# Patient Record
Sex: Female | Born: 1965
Health system: Southern US, Community
[De-identification: ages and names within clinical notes are randomized; demographics above are authoritative.]

## PROBLEM LIST (undated history)

## (undated) DIAGNOSIS — Z9289 Personal history of other medical treatment: Secondary | ICD-10-CM

## (undated) DIAGNOSIS — F419 Anxiety disorder, unspecified: Secondary | ICD-10-CM

## (undated) DIAGNOSIS — G43909 Migraine, unspecified, not intractable, without status migrainosus: Secondary | ICD-10-CM

## (undated) DIAGNOSIS — F32A Depression, unspecified: Secondary | ICD-10-CM

## (undated) DIAGNOSIS — G4700 Insomnia, unspecified: Secondary | ICD-10-CM

## (undated) DIAGNOSIS — G250 Essential tremor: Secondary | ICD-10-CM

## (undated) DIAGNOSIS — T7840XA Allergy, unspecified, initial encounter: Secondary | ICD-10-CM

## (undated) DIAGNOSIS — M1990 Unspecified osteoarthritis, unspecified site: Secondary | ICD-10-CM

## (undated) DIAGNOSIS — K219 Gastro-esophageal reflux disease without esophagitis: Secondary | ICD-10-CM

## (undated) DIAGNOSIS — G709 Myoneural disorder, unspecified: Secondary | ICD-10-CM

## (undated) DIAGNOSIS — F329 Major depressive disorder, single episode, unspecified: Secondary | ICD-10-CM

## (undated) DIAGNOSIS — N393 Stress incontinence (female) (male): Secondary | ICD-10-CM

## (undated) DIAGNOSIS — E039 Hypothyroidism, unspecified: Secondary | ICD-10-CM

## (undated) HISTORY — DX: Depression, unspecified: F32.A

## (undated) HISTORY — DX: Unspecified osteoarthritis, unspecified site: M19.90

## (undated) HISTORY — DX: Stress incontinence (female) (male): N39.3

## (undated) HISTORY — DX: Insomnia, unspecified: G47.00

## (undated) HISTORY — DX: Hypothyroidism, unspecified: E03.9

## (undated) HISTORY — DX: Anxiety disorder, unspecified: F41.9

## (undated) HISTORY — DX: Migraine, unspecified, not intractable, without status migrainosus: G43.909

## (undated) HISTORY — PX: UPPER GASTROINTESTINAL ENDOSCOPY: SHX188

## (undated) HISTORY — DX: Essential tremor: G25.0

## (undated) HISTORY — DX: Personal history of other medical treatment: Z92.89

## (undated) HISTORY — DX: Myoneural disorder, unspecified: G70.9

## (undated) HISTORY — PX: OTHER SURGICAL HISTORY: SHX169

## (undated) HISTORY — DX: Allergy, unspecified, initial encounter: T78.40XA

## (undated) HISTORY — DX: Major depressive disorder, single episode, unspecified: F32.9

---
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
## 1999-04-04 ENCOUNTER — Other Ambulatory Visit: Admission: RE | Admit: 1999-04-04 | Discharge: 1999-04-04 | Payer: Self-pay | Admitting: Obstetrics and Gynecology

## 2000-05-29 ENCOUNTER — Other Ambulatory Visit: Admission: RE | Admit: 2000-05-29 | Discharge: 2000-05-29 | Payer: Self-pay | Admitting: *Deleted

## 2001-07-12 ENCOUNTER — Emergency Department (HOSPITAL_COMMUNITY): Admission: EM | Admit: 2001-07-12 | Discharge: 2001-07-12 | Payer: Self-pay | Admitting: Emergency Medicine

## 2002-11-18 ENCOUNTER — Other Ambulatory Visit: Admission: RE | Admit: 2002-11-18 | Discharge: 2002-11-18 | Payer: Self-pay | Admitting: Obstetrics and Gynecology

## 2004-02-09 ENCOUNTER — Other Ambulatory Visit: Admission: RE | Admit: 2004-02-09 | Discharge: 2004-02-09 | Payer: Self-pay | Admitting: *Deleted

## 2005-06-26 ENCOUNTER — Other Ambulatory Visit: Admission: RE | Admit: 2005-06-26 | Discharge: 2005-06-26 | Payer: Self-pay | Admitting: Obstetrics and Gynecology

## 2005-07-05 ENCOUNTER — Other Ambulatory Visit: Admission: RE | Admit: 2005-07-05 | Discharge: 2005-07-05 | Payer: Self-pay | Admitting: Obstetrics and Gynecology

## 2006-09-12 ENCOUNTER — Other Ambulatory Visit: Admission: RE | Admit: 2006-09-12 | Discharge: 2006-09-12 | Payer: Self-pay | Admitting: Obstetrics & Gynecology

## 2007-12-05 ENCOUNTER — Other Ambulatory Visit: Admission: RE | Admit: 2007-12-05 | Discharge: 2007-12-05 | Payer: Self-pay | Admitting: Obstetrics & Gynecology

## 2008-12-06 ENCOUNTER — Other Ambulatory Visit: Admission: RE | Admit: 2008-12-06 | Discharge: 2008-12-06 | Payer: Self-pay | Admitting: Obstetrics and Gynecology

## 2011-03-18 DIAGNOSIS — G250 Essential tremor: Secondary | ICD-10-CM

## 2011-03-18 HISTORY — DX: Essential tremor: G25.0

## 2011-03-18 LAB — HM PAP SMEAR: HM Pap smear: NORMAL

## 2011-04-13 ENCOUNTER — Other Ambulatory Visit (INDEPENDENT_AMBULATORY_CARE_PROVIDER_SITE_OTHER): Payer: BC Managed Care – PPO

## 2011-04-13 ENCOUNTER — Other Ambulatory Visit: Payer: Self-pay | Admitting: Internal Medicine

## 2011-04-13 ENCOUNTER — Ambulatory Visit (INDEPENDENT_AMBULATORY_CARE_PROVIDER_SITE_OTHER): Payer: BC Managed Care – PPO | Admitting: Internal Medicine

## 2011-04-13 ENCOUNTER — Encounter: Payer: Self-pay | Admitting: Internal Medicine

## 2011-04-13 VITALS — BP 120/78 | HR 68 | Temp 98.3°F | Ht 65.0 in | Wt 159.8 lb

## 2011-04-13 DIAGNOSIS — F329 Major depressive disorder, single episode, unspecified: Secondary | ICD-10-CM

## 2011-04-13 DIAGNOSIS — F32A Depression, unspecified: Secondary | ICD-10-CM

## 2011-04-13 DIAGNOSIS — G250 Essential tremor: Secondary | ICD-10-CM

## 2011-04-13 DIAGNOSIS — F419 Anxiety disorder, unspecified: Secondary | ICD-10-CM

## 2011-04-13 DIAGNOSIS — Z0000 Encounter for general adult medical examination without abnormal findings: Secondary | ICD-10-CM

## 2011-04-13 DIAGNOSIS — E039 Hypothyroidism, unspecified: Secondary | ICD-10-CM

## 2011-04-13 DIAGNOSIS — F411 Generalized anxiety disorder: Secondary | ICD-10-CM

## 2011-04-13 LAB — CBC WITH DIFFERENTIAL/PLATELET
Basophils Absolute: 0 10*3/uL (ref 0.0–0.1)
Basophils Relative: 0.4 % (ref 0.0–3.0)
Eosinophils Absolute: 0.1 10*3/uL (ref 0.0–0.7)
Eosinophils Relative: 1.6 % (ref 0.0–5.0)
HCT: 41.2 % (ref 36.0–46.0)
Hemoglobin: 14.1 g/dL (ref 12.0–15.0)
Lymphocytes Relative: 33 % (ref 12.0–46.0)
Lymphs Abs: 1.9 10*3/uL (ref 0.7–4.0)
MCHC: 34.3 g/dL (ref 30.0–36.0)
MCV: 88.7 fl (ref 78.0–100.0)
Monocytes Absolute: 0.3 10*3/uL (ref 0.1–1.0)
Monocytes Relative: 4.8 % (ref 3.0–12.0)
Neutro Abs: 3.5 10*3/uL (ref 1.4–7.7)
Neutrophils Relative %: 60.2 % (ref 43.0–77.0)
Platelets: 227 10*3/uL (ref 150.0–400.0)
RBC: 4.65 Mil/uL (ref 3.87–5.11)
RDW: 13.9 % (ref 11.5–14.6)
WBC: 5.8 10*3/uL (ref 4.5–10.5)

## 2011-04-13 LAB — BASIC METABOLIC PANEL
BUN: 9 mg/dL (ref 6–23)
CO2: 26 mEq/L (ref 19–32)
Calcium: 8.9 mg/dL (ref 8.4–10.5)
Chloride: 105 mEq/L (ref 96–112)
Creatinine, Ser: 0.8 mg/dL (ref 0.4–1.2)
GFR: 77.09 mL/min (ref >60.00)
Glucose, Bld: 97 mg/dL (ref 70–99)
Potassium: 3.8 mEq/L (ref 3.5–5.1)
Sodium: 141 mEq/L (ref 135–145)

## 2011-04-13 LAB — URINALYSIS
Bilirubin Urine: NEGATIVE
Hgb urine dipstick: NEGATIVE
Ketones, ur: NEGATIVE
Leukocytes, UA: NEGATIVE
Nitrite: NEGATIVE
Specific Gravity, Urine: <=1.005 (ref 1.000–1.030)
Total Protein, Urine: NEGATIVE
Urine Glucose: NEGATIVE
Urobilinogen, UA: 0.2 (ref 0.0–1.0)
pH: 6 (ref 5.0–8.0)

## 2011-04-13 LAB — HEPATIC FUNCTION PANEL
ALT: 16 U/L (ref 0–35)
AST: 19 U/L (ref 0–37)
Albumin: 4.1 g/dL (ref 3.5–5.2)
Alkaline Phosphatase: 42 U/L (ref 39–117)
Bilirubin, Direct: 0.1 mg/dL (ref 0.0–0.3)
Total Bilirubin: 0.7 mg/dL (ref 0.3–1.2)
Total Protein: 7.6 g/dL (ref 6.0–8.3)

## 2011-04-13 LAB — TSH: TSH: 1.4 u[IU]/mL (ref 0.35–5.50)

## 2011-04-13 LAB — LIPID PANEL
Cholesterol: 214 mg/dL — ABNORMAL HIGH (ref 0–200)
HDL: 91.4 mg/dL (ref >39.00)
Total CHOL/HDL Ratio: 2
Triglycerides: 113 mg/dL (ref 0.0–149.0)
VLDL: 22.6 mg/dL (ref 0.0–40.0)

## 2011-04-13 LAB — LDL CHOLESTEROL, DIRECT: Direct LDL: 107.2 mg/dL

## 2011-04-13 MED ORDER — PROPRANOLOL HCL ER 60 MG PO CP24: 60.0000 mg | ORAL_CAPSULE | Freq: Every day | ORAL | Status: DC

## 2011-04-13 MED ORDER — CITALOPRAM HYDROBROMIDE 10 MG PO TABS: 10.0000 mg | ORAL_TABLET | Freq: Every day | ORAL | Status: DC

## 2011-04-13 NOTE — Patient Instructions (Signed)
It was good to see you today. We have reviewed your prior medical history and will send for records including labs and tests from [facility] Test(s) ordered today. Your results will be called to you after review (48-72hours after test completion). If any changes need to be made, you will be notified at that time. Start Celexa for depression/anxiety symptoms and Inderal for head/neck tremor - Your prescription(s) have been submitted to your pharmacy. Please take as directed and contact our office if you believe you are having problem(s) with the medication(s). Ok to try massage as discussed - this will not hurt Please schedule followup in 4-6 weeks to re evaluate symptoms and medications, call sooner if problems.

## 2011-04-13 NOTE — Progress Notes (Signed)
Subjective:    Patient ID: Katherine Garrett, female    DOB: January 09, 1966, 45 y.o.   MRN: [id number]  HPI  New pt to me and our practice, here to establish care - Also patient is here today for annual physical. Patient feels well   complains of voice change and head tremor Onset 3 years ago - "hoarseness" never goes away Not associated with sore throat, GERD symptoms or cough Never a smoker Increased head tremor over same time period, chronic hand tremor also slightly worse symptoms worse with stress and anxiety +FH tremor but no parkinson dz No new medications, no known risk for heavy metal poisoning   Also complains of increasing anxiety and "nervousness" Onset 8 years ago Precipitated by death of spouse to stomach cancer associated with insomnia and increasing fatigue +situational depression but denies SI/HI tx by gyn with celexa x 3 mo with improvement in symptoms - but then med stopped few months ago ("no particular reason", ?no follow up)  Also reviewed chronic medical issues today: Hypothyroid - dx with 2nd pregnancy Feb 05, 1997 - to date, managed by gyn. patient reports compliance with medication(s) as prescribed. Denies adverse side effects. +weight gain but no bowel change; +fatigue, chronic  Migraines - infreq episodes - no need for medications to control same  Past Medical History  Diagnosis Date  . Migraines   . Urine, incontinence, stress female   . Hypothyroid dx 1998  . Anxiety   . Depression   . Insomnia 02-06-2003 onset    following death of spouse    Family History  Problem Relation Age of Onset  . Arthritis Mother   . Ovarian cancer Mother   . Cancer Mother 60    ovarian cancer  . Hyperlipidemia Mother   . Anxiety disorder Mother   . Depression Mother   . Arthritis Father   . Anxiety disorder Father   . Cirrhosis Brother     hep c  . Heart disease Maternal Grandmother   . Heart disease Maternal Grandfather    History  Substance Use Topics  . Smoking  status: Never Smoker   . Smokeless tobacco: Not on file  . Alcohol Use: Yes     rarely    Review of Systems Constitutional: Negative for fever. +weight gain: 14lbs in last 12 mo associated with with decrease in physical activity Respiratory: Negative for cough and shortness of breath.   Cardiovascular: Negative for chest pain.  No syncope or palpiatations Gastrointestinal: Negative for abdominal pain. no nausea and vomiting or bowel changes Musculoskeletal: Negative for gait problem. no back pain or joint swelling. Skin: Negative for rash.  Neurological: Negative for dizziness. positive for tremor - see above No other specific complaints in a complete review of systems (except as listed in HPI above).     Objective:   Physical Exam BP 120/78  Pulse 68  Temp(Src) 98.3 F (36.8 C) (Oral)  Ht 5\' 5"  (1.651 m)  Wt 159 lb 12.8 oz (72.485 kg)  BMI 26.59 kg/m2  SpO2 97%  Constitutional: She is oriented to person, place, and time. She appears well-developed and well-nourished. No distress.  HENT: Head: Normocephalic and atraumatic. Ears; B TMs ok, no erythema or effusion; Nose: Nose normal.  Mouth/Throat: Oropharynx is clear and moist. No oropharyngeal exudate.  Eyes: Conjunctivae and EOM are normal. Pupils are equal, round, and reactive to light. No scleral icterus.  Neck: Normal range of motion. No LAD. No JVD present. No thyromegaly present.  Cardiovascular: Normal rate,  regular rhythm and normal heart sounds.  No murmur heard. No BLE edema. Pulmonary/Chest: Effort normal and breath sounds normal. No respiratory distress. She has no wheezes.  Abdominal: Soft. Bowel sounds are normal. She exhibits no distension. There is no tenderness. No masses. Musculoskeletal: Mild-mod myofascial spasm along R neck in posterior trap region. Otherwise normal range of motion, no joint effusions. No gross deformities Neurological: Postural tremor noted in BUE R>L hands and mild head tremor. No  dysarthria but +voice tremor. Normal gait, normal finger to nose with mild end activity tremor. She is alert and oriented to person, place, and time. No cranial nerve deficit. Coordination normal and balance normal - normal DTRs.  Skin: Skin is warm and dry. No rash noted. No erythema. No wounds. Psychiatric: She has minimally anxious mood and affect. Her behavior is normal. Judgment and thought content normal.   No results found for this basename: WBC, HGB, HCT, PLT, CHOL, TRIG, HDL, LDLDIRECT, ALT, AST, NA, K, CL, CREATININE, BUN, CO2, TSH, PSA, INR, GLUF, HGBA1C, MICROALBUR   EKG - NSR, no ischemic changes or arrythmia     Assessment & Plan:   CPX - v70.0 - Patient has been counseled on age-appropriate routine health concerns for screening and prevention. These are reviewed and up-to-date. Immunizations are up-to-date or declined. Labs ordered and ECG reviewed. Will also send for records from gyn when most medical care has been to date  Also see problem list. Medications reviewed today.

## 2011-04-14 LAB — VITAMIN D 25 HYDROXY (VIT D DEFICIENCY, FRACTURES): Vit D, 25-Hydroxy: 58 ng/mL (ref 30–89)

## 2011-04-15 ENCOUNTER — Encounter: Payer: Self-pay | Admitting: Internal Medicine

## 2011-04-15 DIAGNOSIS — F419 Anxiety disorder, unspecified: Secondary | ICD-10-CM | POA: Insufficient documentation

## 2011-04-15 DIAGNOSIS — G250 Essential tremor: Secondary | ICD-10-CM | POA: Insufficient documentation

## 2011-04-15 DIAGNOSIS — F32A Depression, unspecified: Secondary | ICD-10-CM | POA: Insufficient documentation

## 2011-04-15 DIAGNOSIS — E039 Hypothyroidism, unspecified: Secondary | ICD-10-CM | POA: Insufficient documentation

## 2011-04-15 DIAGNOSIS — F329 Major depressive disorder, single episode, unspecified: Secondary | ICD-10-CM | POA: Insufficient documentation

## 2011-04-15 NOTE — Assessment & Plan Note (Signed)
Check TSH as with CPX labs today - titrate as needed Dx 1998 - stable by history The current medical regimen is effective;  continue present plan and medications.

## 2011-04-15 NOTE — Assessment & Plan Note (Signed)
History and exam most consistent with essential tremor - no red flags for other neurologic deficits, no offending meds Check screening labs as for CPX Start low dose inderal now and plan follow up 6 weeks to review symptoms

## 2011-04-15 NOTE — Assessment & Plan Note (Signed)
Began situational following death of spouse 01-31-2003 - note FH anxiety Recent treatment trial with celexa provided improvement in symptoms - resume low dose celexa now Also to start low dose beta-blocker as for essential tremor - advised may help  symptoms or may exacerbate depression overlay Pt understands and agrees to same - will call if problems follow up 4-6 weeks to review meds and symptoms

## 2011-04-17 ENCOUNTER — Encounter: Payer: Self-pay | Admitting: *Deleted

## 2011-04-18 ENCOUNTER — Telehealth: Payer: Self-pay | Admitting: *Deleted

## 2011-04-18 NOTE — Telephone Encounter (Signed)
Pt call back concerning msg's that was left. She states she has been out of town.  Gave her results concerning labs, also informed her i mailed a letter w/ copy of labs to her address...04/18/11@4 :52pm/LMB

## 2011-05-28 ENCOUNTER — Encounter: Payer: Self-pay | Admitting: Internal Medicine

## 2011-05-28 ENCOUNTER — Ambulatory Visit (INDEPENDENT_AMBULATORY_CARE_PROVIDER_SITE_OTHER): Payer: BC Managed Care – PPO | Admitting: Internal Medicine

## 2011-05-28 VITALS — BP 112/70 | HR 67 | Temp 98.2°F | Ht 65.0 in | Wt 156.8 lb

## 2011-05-28 DIAGNOSIS — E039 Hypothyroidism, unspecified: Secondary | ICD-10-CM

## 2011-05-28 DIAGNOSIS — F411 Generalized anxiety disorder: Secondary | ICD-10-CM

## 2011-05-28 DIAGNOSIS — F419 Anxiety disorder, unspecified: Secondary | ICD-10-CM

## 2011-05-28 DIAGNOSIS — G250 Essential tremor: Secondary | ICD-10-CM

## 2011-05-28 DIAGNOSIS — Z23 Encounter for immunization: Secondary | ICD-10-CM

## 2011-05-28 MED ORDER — PROPRANOLOL HCL 10 MG PO TABS: 10.0000 mg | ORAL_TABLET | Freq: Three times a day (TID) | ORAL | Status: DC | PRN

## 2011-05-28 NOTE — Assessment & Plan Note (Signed)
History and exam most consistent with essential tremor - no red flags for other neurologic deficits, no offending meds continue low dose ext release inderal , add prn 10mg  dose for breakthru symptoms  plan follow up 3  to review symptoms - sooner if problems

## 2011-05-28 NOTE — Patient Instructions (Signed)
It was good to see you today. continue Celexa for depression/anxiety symptoms and Inderal for head/neck tremor -  alos use low dose quick acting inderal for breakthru anxiety/tremor as needed - Your prescription(s) have been submitted to your pharmacy. Please take as directed and contact our office if you believe you are having problem(s) with the medication(s). Please schedule followup in 3-4 monthss to re evaluate symptoms and medications, call sooner if problems.

## 2011-05-28 NOTE — Assessment & Plan Note (Signed)
Began situational following death of spouse 01/28/2003 - note FH anxiety Resumed celexa 03/2011 improvement in symptoms -but ?borderline ahendonia Discussed options - will continue same without change for now Also continue low dose beta-blocker as for essential tremor - advised may help  symptoms or may exacerbate depression overlay Pt understands and agrees to same - will call if problems follow up 3-4 monhts to review meds and symptoms

## 2011-05-28 NOTE — Assessment & Plan Note (Signed)
Dx 1998 - stable by history The current medical regimen is effective;  continue present plan and medications.  Lab Results  Component Value Date   TSH 1.40 04/13/2011

## 2011-05-28 NOTE — Progress Notes (Signed)
  Subjective:    Patient ID: Katherine Garrett, female    DOB: 09/04/1966, 45 y.o.   MRN: [id number]  HPI  Here for follow up -   benign essential tremor - dx clarified 03/2011 -  started Inderal LA 60 for same and symptoms improved but not voice change resolved - head/hands much better associated with voice > head > B hands tremor Onset 02/02/08 -  describes voice as "hoarseness" never goes away, worse with nervous/stressed Not associated with sore throat, GERD symptoms or cough Never a smoker Increased head tremor over same time period, chronic hand tremor also worse symptoms worse with stress and anxiety +FH tremor but no parkinson dz  anxiety and "nervousness" Onset 02/02/03 - Precipitated by death of spouse to stomach cancer associated with insomnia and motivational/physical fatigue +situational depression but denies SI/HI Resumed celexa 03/2011 - feels improved mood swings and less irritability but +anhedonia- "i don't even cry now"  Hypothyroid - dx with 2nd pregnancy 1997-02-01 - previously managed by gyn. patient reports compliance with medication(s) as prescribed. Denies adverse side effects. +weight gain but no bowel change; +fatigue, chronic  Migraines - infreq episodes - no need for medications to control same  Past Medical History  Diagnosis Date  . Migraines   . Urine, incontinence, stress female   . Hypothyroid dx 1998  . Anxiety   . Depression   . Insomnia 2004 onset    following death of spouse  . Tremor, essential     Review of Systems  Constitutional: Negative for fever. or unexpected weight change Respiratory: Negative for cough and shortness of breath.   Cardiovascular: Negative for chest pain.  No syncope or palpiatations     Objective:   Physical Exam  BP 112/70  Pulse 67  Temp(Src) 98.2 F (36.8 C) (Oral)  Ht 5\' 5"  (1.651 m)  Wt 156 lb 12.8 oz (71.124 kg)  BMI 26.09 kg/m2  SpO2 96%  Constitutional: She appears well-developed and well-nourished. No  distress.  Eyes: Conjunctivae and EOM are normal. Pupils are equal, round, and reactive to light. No scleral icterus.  Neck: Normal range of motion. No LAD. No JVD present. No thyromegaly present.  Cardiovascular: Normal rate, regular rhythm and normal heart sounds.  No murmur heard. No BLE edema. Pulmonary/Chest: Effort normal and breath sounds normal. No respiratory distress. She has no wheezes. Neurological: Postural tremor in hands resolved -very mild head tremor improved. Continued moderate +voice tremor. Normal gait, normal finger to nose with mild end activity tremor. She is alert and oriented to person, place, and time. No cranial nerve deficit. Coordination normal and balance normal - normal DTRs. Psychiatric: She has minimally anxious mood and affect. Her behavior is normal. Judgment and thought content normal.    Lab Results  Component Value Date   WBC 5.8 04/13/2011   HGB 14.1 04/13/2011   HCT 41.2 04/13/2011   PLT 227.0 04/13/2011   CHOL 214* 04/13/2011   TRIG 113.0 04/13/2011   HDL 91.40 04/13/2011   LDLDIRECT 107.2 04/13/2011   ALT 16 04/13/2011   AST 19 04/13/2011   NA 141 04/13/2011   K 3.8 04/13/2011   CL 105 04/13/2011   CREATININE 0.8 04/13/2011   BUN 9 04/13/2011   CO2 26 04/13/2011   TSH 1.40 04/13/2011       Assessment & Plan:   see problem list. Medications reviewed today.

## 2011-07-12 ENCOUNTER — Other Ambulatory Visit: Payer: Self-pay | Admitting: Internal Medicine

## 2011-08-10 ENCOUNTER — Other Ambulatory Visit: Payer: Self-pay | Admitting: Internal Medicine

## 2011-10-13 ENCOUNTER — Other Ambulatory Visit: Payer: Self-pay | Admitting: Internal Medicine

## 2012-01-15 ENCOUNTER — Other Ambulatory Visit: Payer: Self-pay | Admitting: *Deleted

## 2012-01-15 DIAGNOSIS — G250 Essential tremor: Secondary | ICD-10-CM

## 2012-01-15 MED ORDER — PROPRANOLOL HCL ER 60 MG PO CP24: 60.0000 mg | ORAL_CAPSULE | Freq: Every day | ORAL | Status: DC

## 2012-01-15 MED ORDER — CITALOPRAM HYDROBROMIDE 10 MG PO TABS: 10.0000 mg | ORAL_TABLET | Freq: Every day | ORAL | Status: DC

## 2012-01-26 ENCOUNTER — Other Ambulatory Visit: Payer: Self-pay | Admitting: Internal Medicine

## 2012-04-21 ENCOUNTER — Other Ambulatory Visit: Payer: Self-pay | Admitting: Internal Medicine

## 2012-06-15 ENCOUNTER — Other Ambulatory Visit: Payer: Self-pay | Admitting: Internal Medicine

## 2012-10-27 ENCOUNTER — Other Ambulatory Visit: Payer: Self-pay | Admitting: Internal Medicine

## 2012-10-27 NOTE — Telephone Encounter (Signed)
Katherine Garrett, Will refill but has not seen Dr. Milton Ferguson in over a year. Will need appt before further refills can be given [facility]

## 2012-11-24 ENCOUNTER — Other Ambulatory Visit: Payer: Self-pay | Admitting: Internal Medicine

## 2013-03-02 ENCOUNTER — Other Ambulatory Visit: Payer: Self-pay | Admitting: Internal Medicine

## 2013-04-11 ENCOUNTER — Other Ambulatory Visit: Payer: Self-pay | Admitting: Internal Medicine

## 2013-05-04 ENCOUNTER — Other Ambulatory Visit: Payer: Self-pay | Admitting: Internal Medicine

## 2013-05-22 ENCOUNTER — Other Ambulatory Visit: Payer: Self-pay | Admitting: Nurse Practitioner

## 2013-05-22 NOTE — Telephone Encounter (Signed)
eScribe request for refill on KARIVA Last filled - 06/06/12 X 1 YEAR Last AEX - 06/06/12 Next AEX - 06/09/13 1 month supply sent until AEX

## 2013-05-25 ENCOUNTER — Other Ambulatory Visit: Payer: Self-pay | Admitting: Nurse Practitioner

## 2013-05-27 ENCOUNTER — Other Ambulatory Visit: Payer: Self-pay

## 2013-05-27 ENCOUNTER — Telehealth: Payer: Self-pay | Admitting: Internal Medicine

## 2013-05-27 DIAGNOSIS — Z1231 Encounter for screening mammogram for malignant neoplasm of breast: Secondary | ICD-10-CM

## 2013-05-27 MED ORDER — PROPRANOLOL HCL ER 60 MG PO CP24: ORAL_CAPSULE | ORAL | Status: DC

## 2013-05-27 NOTE — Telephone Encounter (Signed)
Pt's LOV was in 2012.  I scheduled her physical on the next avail., Nov 24.  She needs a refill on Inderal.  Could she have enough to last to her appt?

## 2013-05-27 NOTE — Telephone Encounter (Signed)
ok 

## 2013-05-27 NOTE — Telephone Encounter (Signed)
Sent refill to pharmacy enough until pt see md on nov 24...lmb

## 2013-05-28 ENCOUNTER — Other Ambulatory Visit: Payer: Self-pay | Admitting: Internal Medicine

## 2013-06-01 ENCOUNTER — Ambulatory Visit
Admission: RE | Admit: 2013-06-01 | Discharge: 2013-06-01 | Disposition: A | Discharge status: Home / Self Care | Payer: BC Managed Care – PPO | Source: Ambulatory Visit

## 2013-06-01 DIAGNOSIS — Z1231 Encounter for screening mammogram for malignant neoplasm of breast: Secondary | ICD-10-CM

## 2013-06-09 ENCOUNTER — Ambulatory Visit (INDEPENDENT_AMBULATORY_CARE_PROVIDER_SITE_OTHER): Payer: BC Managed Care – PPO | Admitting: Nurse Practitioner

## 2013-06-09 ENCOUNTER — Encounter: Payer: Self-pay | Admitting: Nurse Practitioner

## 2013-06-09 VITALS — BP 110/72 | HR 60 | Resp 20 | Ht 64.5 in | Wt 169.0 lb

## 2013-06-09 DIAGNOSIS — Z0000 Encounter for general adult medical examination without abnormal findings: Secondary | ICD-10-CM

## 2013-06-09 DIAGNOSIS — Z01419 Encounter for gynecological examination (general) (routine) without abnormal findings: Secondary | ICD-10-CM

## 2013-06-09 LAB — HM PAP SMEAR

## 2013-06-09 LAB — POCT URINALYSIS DIPSTICK
Bilirubin, UA: 1
Glucose, UA: NEGATIVE
Ketones, UA: NEGATIVE
Nitrite, UA: NEGATIVE
Urobilinogen, UA: NEGATIVE
pH, UA: 5

## 2013-06-09 MED ORDER — DESOGESTREL-ETHINYL ESTRADIOL 0.15-0.02/0.01 MG (21/5) PO TABS: ORAL_TABLET | ORAL | Status: DC

## 2013-06-09 NOTE — Progress Notes (Signed)
Patient ID: Katherine Garrett, female   DOB: 1966/09/16, 47 y.o.   MRN: [id number] 47 y.o. G40P2010 Widowed Caucasian Fe here for annual exam.   Menses now at 4-5 days. Some bloating.  Not dating or sexually for 2 years.  Mother who had been ill has now passed away.  That also has caused depressive symptoms to exacerbate.  She will be seeing PCP in about a month and will discuss this then.  Considering classes at [facility] [employer] to reenter into the nursing program.  Oldest son has graduated from college and youngest son is doing OK now.  Patient's last menstrual period was 05/21/2013.          Sexually active: no  The current method of family planning is none.    Exercising: yes  Gym/ health club routine includes light weights and and working on various equipment. Smoker:  no  Health Maintenance: Pap:  06/06/12, WNL, neg HR HPV MMG:  06/01/13, BI-Rads 1: negative TDaP: 05/28/11 @ PCP Labs: declined HB Urine: 1+ leuk's, tr blood, tr protein, tr bili, pH 5.0 (asymptomatic)   reports that she has never smoked. She has never used smokeless tobacco. She reports that  drinks alcohol. She reports that she does not use illicit drugs.  Past Medical History  Diagnosis Date  . Migraines   . Urine, incontinence, stress female   . Hypothyroid dx 1998  . Anxiety   . Depression   . Insomnia 2004 onset    following death of spouse  . Tremor, essential 03/2011    Past Surgical History  Procedure Laterality Date  . Tonsillectomy  1970  . Child birth  50 & 10    x's 2    Current Outpatient Prescriptions  Medication Sig Dispense Refill  . citalopram (CELEXA) 10 MG tablet TAKE 1 TABLET (10 MG TOTAL) BY MOUTH DAILY.  30 tablet  2  . desogestrel-ethinyl estradiol (KARIVA,AZURETTE,MIRCETTE) 0.15-0.02/0.01 MG (21/5) tablet TAKE 1 TABLET BY MOUTH EVERY DAY  84 tablet  3  . levothyroxine (SYNTHROID, LEVOTHROID) 112 MCG tablet Take 1 by mouth daily      . propranolol (INDERAL) 10 MG tablet TAKE 1 TABLET BY MOUTH 3 TIMES A  DAY AS NEEDED FOR ANXIETY  90 tablet  0  . propranolol ER (INDERAL LA) 60 MG 24 hr capsule Take 60 mg by mouth daily.       No current facility-administered medications for this visit.    Family History  Problem Relation Age of Onset  . Arthritis Mother   . Ovarian cancer Mother   . Cancer Mother 73    ovarian cancer  . Hyperlipidemia Mother   . Anxiety disorder Mother   . Depression Mother   . Arthritis Father   . Anxiety disorder Father   . Diabetes Father   . Cirrhosis Brother     hep c  . Heart disease Maternal Grandmother   . Heart disease Maternal Grandfather     ROS:  Pertinent items are noted in HPI.  Otherwise, a comprehensive ROS was negative.  Exam:   BP 110/72  Pulse 60  Resp 20  Ht 5' 4.5" (1.638 m)  Wt 169 lb (76.658 kg)  BMI 28.57 kg/m2  LMP 05/21/2013 Height: 5' 4.5" (163.8 cm)  Ht Readings from Last 3 Encounters:  06/09/13 5' 4.5" (1.638 m)  05/28/11 5\' 5"  (1.651 m)  04/13/11 5\' 5"  (1.651 m)    General appearance: alert, cooperative and appears stated age Head: Normocephalic, without obvious  abnormality, atraumatic Neck: no adenopathy, supple, symmetrical, trachea midline and thyroid normal to inspection and palpation Lungs: clear to auscultation bilaterally Breasts: normal appearance, no masses or tenderness Heart: regular rate and rhythm Abdomen: soft, non-tender; no masses,  no organomegaly Extremities: extremities normal, atraumatic, no cyanosis or edema Skin: Skin color, texture, turgor normal. No rashes or lesions Lymph nodes: Cervical, supraclavicular, and axillary nodes normal. No abnormal inguinal nodes palpated Neurologic: Grossly normal   Pelvic: External genitalia:  no lesions              Urethra:  normal appearing urethra with no masses, tenderness or lesions              Bartholin's and Skene's: normal                 Vagina: normal appearing vagina with normal color and discharge, no lesions              Cervix: anteverted               Pap taken: no Bimanual Exam:  Uterus:  normal size, contour, position, consistency, mobility, non-tender              Adnexa: no mass, fullness, tenderness               Rectovaginal: Confirms               Anus:  normal sphincter tone, no lesions  A:  Well Woman with normal exam  History of hypothyroid  OCP for regulation of menses  P:   Pap smear as per guidelines   Mammogram due again 9/15  PCP is now doing labs and will get her refill of Synthroid there. (per patient she has enough to last)  Counseled on breast self exam, adequate intake of calcium and vitamin D, diet and exercise return annually or prn  An After Visit Summary was printed and given to the patient.

## 2013-06-09 NOTE — Patient Instructions (Signed)

## 2013-06-15 NOTE — Progress Notes (Signed)
Encounter reviewed by Dr. Tameah Mihalko Silva.  

## 2013-07-05 ENCOUNTER — Other Ambulatory Visit: Payer: Self-pay | Admitting: Nurse Practitioner

## 2013-07-06 ENCOUNTER — Other Ambulatory Visit: Payer: Self-pay | Admitting: Internal Medicine

## 2013-07-06 NOTE — Telephone Encounter (Signed)
According to AEX note patient is getting Synthroid from PCP.  Rx denied.

## 2013-07-11 ENCOUNTER — Other Ambulatory Visit: Payer: Self-pay | Admitting: Nurse Practitioner

## 2013-07-16 ENCOUNTER — Telehealth: Payer: Self-pay | Admitting: Nurse Practitioner

## 2013-07-16 NOTE — Telephone Encounter (Signed)
Patient needs a one month refill on synthroid to hold her over until her PCP appt on 11/24. Please call patient when complete. [facility] [address].

## 2013-07-17 ENCOUNTER — Other Ambulatory Visit: Payer: Self-pay | Admitting: Nurse Practitioner

## 2013-07-17 NOTE — Telephone Encounter (Signed)
rx sent for 1 mth with no refills. Pt notified

## 2013-07-23 ENCOUNTER — Other Ambulatory Visit: Payer: Self-pay

## 2013-08-06 ENCOUNTER — Other Ambulatory Visit: Payer: Self-pay | Admitting: Internal Medicine

## 2013-08-10 ENCOUNTER — Encounter: Payer: Self-pay | Admitting: Internal Medicine

## 2013-08-10 ENCOUNTER — Ambulatory Visit (INDEPENDENT_AMBULATORY_CARE_PROVIDER_SITE_OTHER): Payer: BC Managed Care – PPO | Admitting: Internal Medicine

## 2013-08-10 ENCOUNTER — Encounter: Payer: BC Managed Care – PPO | Admitting: Internal Medicine

## 2013-08-10 ENCOUNTER — Other Ambulatory Visit (INDEPENDENT_AMBULATORY_CARE_PROVIDER_SITE_OTHER): Payer: BC Managed Care – PPO

## 2013-08-10 VITALS — BP 122/80 | HR 62 | Temp 97.9°F | Ht 64.5 in | Wt 179.0 lb

## 2013-08-10 DIAGNOSIS — Z23 Encounter for immunization: Secondary | ICD-10-CM

## 2013-08-10 DIAGNOSIS — Z0000 Encounter for general adult medical examination without abnormal findings: Secondary | ICD-10-CM

## 2013-08-10 DIAGNOSIS — E039 Hypothyroidism, unspecified: Secondary | ICD-10-CM

## 2013-08-10 DIAGNOSIS — F329 Major depressive disorder, single episode, unspecified: Secondary | ICD-10-CM

## 2013-08-10 DIAGNOSIS — G250 Essential tremor: Secondary | ICD-10-CM

## 2013-08-10 DIAGNOSIS — F32A Depression, unspecified: Secondary | ICD-10-CM

## 2013-08-10 LAB — BASIC METABOLIC PANEL
BUN: 11 mg/dL (ref 6–23)
CO2: 26 mEq/L (ref 19–32)
Calcium: 8.8 mg/dL (ref 8.4–10.5)
Chloride: 107 mEq/L (ref 96–112)
Creatinine, Ser: 0.9 mg/dL (ref 0.4–1.2)
GFR: 71.42 mL/min (ref >60.00)
Glucose, Bld: 95 mg/dL (ref 70–99)
Potassium: 4 mEq/L (ref 3.5–5.1)
Sodium: 138 mEq/L (ref 135–145)

## 2013-08-10 LAB — URINALYSIS, ROUTINE W REFLEX MICROSCOPIC
Bilirubin Urine: NEGATIVE
Hgb urine dipstick: NEGATIVE
Ketones, ur: NEGATIVE
Leukocytes, UA: NEGATIVE
Nitrite: NEGATIVE
RBC / HPF: NONE SEEN (ref 0–?)
Specific Gravity, Urine: <=1.005 (ref 1.000–1.030)
Total Protein, Urine: NEGATIVE
Urine Glucose: NEGATIVE
Urobilinogen, UA: 0.2 (ref 0.0–1.0)
WBC, UA: NONE SEEN (ref 0–?)
pH: 6 (ref 5.0–8.0)

## 2013-08-10 LAB — TSH: TSH: 3.95 u[IU]/mL (ref 0.35–5.50)

## 2013-08-10 LAB — HEPATIC FUNCTION PANEL
ALT: 15 U/L (ref 0–35)
AST: 17 U/L (ref 0–37)
Albumin: 3.5 g/dL (ref 3.5–5.2)
Alkaline Phosphatase: 36 U/L — ABNORMAL LOW (ref 39–117)
Bilirubin, Direct: 0.1 mg/dL (ref 0.0–0.3)
Total Bilirubin: 0.5 mg/dL (ref 0.3–1.2)
Total Protein: 6.9 g/dL (ref 6.0–8.3)

## 2013-08-10 LAB — CBC WITH DIFFERENTIAL/PLATELET
Basophils Absolute: 0 10*3/uL (ref 0.0–0.1)
Basophils Relative: 0.4 % (ref 0.0–3.0)
Eosinophils Absolute: 0.2 10*3/uL (ref 0.0–0.7)
Eosinophils Relative: 1.6 % (ref 0.0–5.0)
HCT: 40.8 % (ref 36.0–46.0)
Hemoglobin: 13.9 g/dL (ref 12.0–15.0)
Lymphocytes Relative: 26.7 % (ref 12.0–46.0)
Lymphs Abs: 2.5 10*3/uL (ref 0.7–4.0)
MCHC: 34 g/dL (ref 30.0–36.0)
MCV: 88.4 fl (ref 78.0–100.0)
Monocytes Absolute: 0.5 10*3/uL (ref 0.1–1.0)
Monocytes Relative: 5.5 % (ref 3.0–12.0)
Neutro Abs: 6.3 10*3/uL (ref 1.4–7.7)
Neutrophils Relative %: 65.8 % (ref 43.0–77.0)
Platelets: 255 10*3/uL (ref 150.0–400.0)
RBC: 4.61 Mil/uL (ref 3.87–5.11)
RDW: 14.3 % (ref 11.5–14.6)
WBC: 9.5 10*3/uL (ref 4.5–10.5)

## 2013-08-10 LAB — LIPID PANEL
Cholesterol: 206 mg/dL — ABNORMAL HIGH (ref 0–200)
HDL: 83 mg/dL (ref >39.00)
Total CHOL/HDL Ratio: 2
Triglycerides: 133 mg/dL (ref 0.0–149.0)
VLDL: 26.6 mg/dL (ref 0.0–40.0)

## 2013-08-10 LAB — LDL CHOLESTEROL, DIRECT: Direct LDL: 110.1 mg/dL

## 2013-08-10 MED ORDER — CITALOPRAM HYDROBROMIDE 20 MG PO TABS: 20.0000 mg | ORAL_TABLET | Freq: Every day | ORAL | Status: DC

## 2013-08-10 NOTE — Patient Instructions (Addendum)
It was good to see you today.  We have reviewed your prior records including labs and tests today  Health Maintenance reviewed - all recommended immunizations and age-appropriate screenings are up-to-date. Your annual flu shot was given and/or updated today.  Test(s) ordered today. Your results will be released to MyChart (or called to you) after review, usually within 72hours after test completion. If any changes need to be made, you will be notified at that same time.  Medications reviewed and updated  Increase Celexa to 20mg  daily for depression - no other changes recommended at this time.  Your prescription(s) have been submitted to your pharmacy. Please take as directed and contact our office if you believe you are having problem(s) with the medication(s).  Please schedule followup in 6-12 weeks to recheck depression and medications, call sooner if problems.  Health Maintenance, Female A healthy lifestyle and preventative care can promote health and wellness.  Maintain regular health, dental, and eye exams.  Eat a healthy diet. Foods like vegetables, fruits, whole grains, low-fat dairy products, and lean protein foods contain the nutrients you need without too many calories. Decrease your intake of foods high in solid fats, added sugars, and salt. Get information about a proper diet from your caregiver, if necessary.  Regular physical exercise is one of the most important things you can do for your health. Most adults should get at least 150 minutes of moderate-intensity exercise (any activity that increases your heart rate and causes you to sweat) each week. In addition, most adults need muscle-strengthening exercises on 2 or more days a week.   Maintain a healthy weight. The body mass index (BMI) is a screening tool to identify possible weight problems. It provides an estimate of body fat based on height and weight. Your caregiver can help determine your BMI, and can help you  achieve or maintain a healthy weight. For adults 20 years and older:  A BMI below 18.5 is considered underweight.  A BMI of 18.5 to 24.9 is normal.  A BMI of 25 to 29.9 is considered overweight.  A BMI of 30 and above is considered obese.  Maintain normal blood lipids and cholesterol by exercising and minimizing your intake of saturated fat. Eat a balanced diet with plenty of fruits and vegetables. Blood tests for lipids and cholesterol should begin at age 36 and be repeated every 5 years. If your lipid or cholesterol levels are high, you are over 50, or you are a high risk for heart disease, you may need your cholesterol levels checked more frequently.Ongoing high lipid and cholesterol levels should be treated with medicines if diet and exercise are not effective.  If you smoke, find out from your caregiver how to quit. If you do not use tobacco, do not start.  Lung cancer screening is recommended for adults aged 61 80 years who are at high risk for developing lung cancer because of a history of smoking. Yearly low-dose computed tomography (CT) is recommended for people who have at least a 30-pack-year history of smoking and are a current smoker or have quit within the past 15 years. A pack year of smoking is smoking an average of 1 pack of cigarettes a day for 1 year (for example: 1 pack a day for 30 years or 2 packs a day for 15 years). Yearly screening should continue until the smoker has stopped smoking for at least 15 years. Yearly screening should also be stopped for people who develop a health problem that  would prevent them from having lung cancer treatment.  If you are pregnant, do not drink alcohol. If you are breastfeeding, be very cautious about drinking alcohol. If you are not pregnant and choose to drink alcohol, do not exceed 1 drink per day. One drink is considered to be 12 ounces (355 mL) of beer, 5 ounces (148 mL) of wine, or 1.5 ounces (44 mL) of liquor.  Avoid use of street  drugs. Do not share needles with anyone. Ask for help if you need support or instructions about stopping the use of drugs.  High blood pressure causes heart disease and increases the risk of stroke. Blood pressure should be checked at least every 1 to 2 years. Ongoing high blood pressure should be treated with medicines, if weight loss and exercise are not effective.  If you are 71 to 49 years old, ask your caregiver if you should take aspirin to prevent strokes.  Diabetes screening involves taking a blood sample to check your fasting blood sugar level. This should be done once every 3 years, after age 18, if you are within normal weight and without risk factors for diabetes. Testing should be considered at a younger age or be carried out more frequently if you are overweight and have at least 1 risk factor for diabetes.  Breast cancer screening is essential preventative care for women. You should practice "breast self-awareness." This means understanding the normal appearance and feel of your breasts and may include breast self-examination. Any changes detected, no matter how small, should be reported to a caregiver. Women in their 44s and 30s should have a clinical breast exam (CBE) by a caregiver as part of a regular health exam every 1 to 3 years. After age 82, women should have a CBE every year. Starting at age 67, women should consider having a mammogram (breast X-ray) every year. Women who have a family history of breast cancer should talk to their caregiver about genetic screening. Women at a high risk of breast cancer should talk to their caregiver about having an MRI and a mammogram every year.  Breast cancer gene (BRCA)-related cancer risk assessment is recommended for women who have family members with BRCA-related cancers. BRCA-related cancers include breast, ovarian, tubal, and peritoneal cancers. Having family members with these cancers may be associated with an increased risk for harmful  changes (mutations) in the breast cancer genes BRCA1 and BRCA2. Results of the assessment will determine the need for genetic counseling and BRCA1 and BRCA2 testing.  The Pap test is a screening test for cervical cancer. Women should have a Pap test starting at age 49. Between ages 98 and 19, Pap tests should be repeated every 2 years. Beginning at age 39, you should have a Pap test every 3 years as long as the past 3 Pap tests have been normal. If you had a hysterectomy for a problem that was not cancer or a condition that could lead to cancer, then you no longer need Pap tests. If you are between ages 12 and 33, and you have had normal Pap tests going back 10 years, you no longer need Pap tests. If you have had past treatment for cervical cancer or a condition that could lead to cancer, you need Pap tests and screening for cancer for at least 20 years after your treatment. If Pap tests have been discontinued, risk factors (such as a new sexual partner) need to be reassessed to determine if screening should be resumed. Some women have  medical problems that increase the chance of getting cervical cancer. In these cases, your caregiver may recommend more frequent screening and Pap tests.  The human papillomavirus (HPV) test is an additional test that may be used for cervical cancer screening. The HPV test looks for the virus that can cause the cell changes on the cervix. The cells collected during the Pap test can be tested for HPV. The HPV test could be used to screen women aged 32 years and older, and should be used in women of any age who have unclear Pap test results. After the age of 51, women should have HPV testing at the same frequency as a Pap test.  Colorectal cancer can be detected and often prevented. Most routine colorectal cancer screening begins at the age of 42 and continues through age 23. However, your caregiver may recommend screening at an earlier age if you have risk factors for colon  cancer. On a yearly basis, your caregiver may provide home test kits to check for hidden blood in the stool. Use of a small camera at the end of a tube, to directly examine the colon (sigmoidoscopy or colonoscopy), can detect the earliest forms of colorectal cancer. Talk to your caregiver about this at age 81, when routine screening begins. Direct examination of the colon should be repeated every 5 to 10 years through age 40, unless early forms of pre-cancerous polyps or small growths are found.  Hepatitis C blood testing is recommended for all people born from [birth year] through 1967 and any individual with known risks for hepatitis C.  Practice safe sex. Use condoms and avoid high-risk sexual practices to reduce the spread of sexually transmitted infections (STIs). Sexually active women aged 68 and younger should be checked for Chlamydia, which is a common sexually transmitted infection. Older women with new or multiple partners should also be tested for Chlamydia. Testing for other STIs is recommended if you are sexually active and at increased risk.  Osteoporosis is a disease in which the bones lose minerals and strength with aging. This can result in serious bone fractures. The risk of osteoporosis can be identified using a bone density scan. Women ages 51 and over and women at risk for fractures or osteoporosis should discuss screening with their caregivers. Ask your caregiver whether you should be taking a calcium supplement or vitamin D to reduce the rate of osteoporosis.  Menopause can be associated with physical symptoms and risks. Hormone replacement therapy is available to decrease symptoms and risks. You should talk to your caregiver about whether hormone replacement therapy is right for you.  Use sunscreen. Apply sunscreen liberally and repeatedly throughout the day. You should seek shade when your shadow is shorter than you. Protect yourself by wearing long sleeves, pants, a wide-brimmed hat, and  sunglasses year round, whenever you are outdoors.  Notify your caregiver of new moles or changes in moles, especially if there is a change in shape or color. Also notify your caregiver if a mole is larger than the size of a pencil eraser.  Stay current with your immunizations. Document Released: 03/19/2011 Document Revised: 12/29/2012 Document Reviewed: 03/19/2011 [facility] Patient Information 2014 [place], [place]. Depression, Adult Depression refers to feeling sad, low, down in the dumps, blue, gloomy, or empty. In general, there are two kinds of depression: 1. Depression that we all experience from time to time because of upsetting life experiences, including the loss of a job or the ending of a relationship (normal sadness or normal grief).  This kind of depression is considered normal, is short lived, and resolves within a few days to 2 weeks. (Depression experienced after the loss of a loved one is called bereavement. Bereavement often lasts longer than 2 weeks but normally gets better with time.) 2. Clinical depression, which lasts longer than normal sadness or normal grief or interferes with your ability to function at home, at work, and in school. It also interferes with your personal relationships. It affects almost every aspect of your life. Clinical depression is an illness. Symptoms of depression also can be caused by conditions other than normal sadness and grief or clinical depression. Examples of these conditions are listed as follows:  Physical illness Some physical illnesses, including underactive thyroid gland (hypothyroidism), severe anemia, specific types of cancer, diabetes, uncontrolled seizures, heart and lung problems, strokes, and chronic pain are commonly associated with symptoms of depression.  Side effects of some prescription medicine In some people, certain types of prescription medicine can cause symptoms of depression.  Substance abuse Abuse of alcohol and illicit drugs  can cause symptoms of depression. SYMPTOMS Symptoms of normal sadness and normal grief include the following:  Feeling sad or crying for short periods of time.  Not caring about anything (apathy).  Difficulty sleeping or sleeping too much.  No longer able to enjoy the things you used to enjoy.  Desire to be by oneself all the time (social isolation).  Lack of energy or motivation.  Difficulty concentrating or remembering.  Change in appetite or weight.  Restlessness or agitation. Symptoms of clinical depression include the same symptoms of normal sadness or normal grief and also the following symptoms:  Feeling sad or crying all the time.  Feelings of guilt or worthlessness.  Feelings of hopelessness or helplessness.  Thoughts of suicide or the desire to harm yourself (suicidal ideation).  Loss of touch with reality (psychotic symptoms). Seeing or hearing things that are not real (hallucinations) or having false beliefs about your life or the people around you (delusions and paranoia). DIAGNOSIS  The diagnosis of clinical depression usually is based on the severity and duration of the symptoms. Your caregiver also will ask you questions about your medical history and substance use to find out if physical illness, use of prescription medicine, or substance abuse is causing your depression. Your caregiver also may order blood tests. TREATMENT  Typically, normal sadness and normal grief do not require treatment. However, sometimes antidepressant medicine is prescribed for bereavement to ease the depressive symptoms until they resolve. The treatment for clinical depression depends on the severity of your symptoms but typically includes antidepressant medicine, counseling with a mental health professional, or a combination of both. Your caregiver will help to determine what treatment is best for you. Depression caused by physical illness usually goes away with appropriate medical  treatment of the illness. If prescription medicine is causing depression, talk with your caregiver about stopping the medicine, decreasing the dose, or substituting another medicine. Depression caused by abuse of alcohol or illicit drugs abuse goes away with abstinence from these substances. Some adults need professional help in order to stop drinking or using drugs. SEEK IMMEDIATE CARE IF:  You have thoughts about hurting yourself or others.  You lose touch with reality (have psychotic symptoms).  You are taking medicine for depression and have a serious side effect. FOR MORE INFORMATION National [employer] on Mental Illness: www.nami.[employer]: http://www.maynard.net/ Document Released: 08/31/2000 Document Revised: 03/04/2012 Document Reviewed: 12/03/2011 [employer] Patient Information  2014 [employer].  

## 2013-08-10 NOTE — Progress Notes (Signed)
Pre visit review using our clinic review tool, if applicable. No additional management support is needed unless otherwise documented below in the visit note. 

## 2013-08-10 NOTE — Progress Notes (Signed)
Subjective:    Patient ID: Katherine Garrett, female    DOB: 10-30-1965, 47 y.o.   MRN: [id number]  HPI  The patient is here today for annual wellness exam.   Chronic medical issues also reviewed.  benign essential tremor - dx clarified 03/2011 -  started Inderal LA 60 for same and symptoms improved but not voice change resolved - head/hands much better  associated with voice > head > B hands tremor  Onset 01/30/2008 - describes voice as "hoarseness" never goes away, worse with nervous/stressed. Not associated with sore throat, GERD symptoms or cough. Never a smoker  Increased head tremor over same time period, chronic hand tremor also worse. symptoms worse with stress and anxiety. +FH tremor but no parkinson dz.  Takes inderal prn when tremor worsens.  Worries that inderal is causing increased fatigue.   anxiety and depression - manifest with "nervous" feelings  Onset 2003/01/30 - Precipitated by death of spouse to stomach cancer; associated with insomnia and motivational/physical fatigue; +situational depression but denies SI/HI  Resumed celexa 03/2011 with improvement.  Mom passed away of ovarian cancer 30-Jan-2012 - causing worsening depressive symptoms - questions need to increase dose of Celexa.   Hypothyroid - dx with 2nd pregnancy Jan 29, 1997 - previously managed by gyn. patient reports compliance with medication(s) as prescribed. Denies adverse side effects. +weight gain but no bowel change; +fatigue, chronic   Migraines - infreq episodes - no need for medications to control same. Worse with insomnia.  Past Medical History  Diagnosis Date  . Migraines   . Urine, incontinence, stress female   . Hypothyroid dx 1998  . Anxiety   . Depression   . Insomnia 2004 onset    following death of spouse  . Tremor, essential 03/2011   Past Surgical History  Procedure Laterality Date  . Tonsillectomy  1970  . Child birth  75 & 73    x's 2    Family History  Problem Relation Age of Onset  . Arthritis Mother    . Ovarian cancer Mother   . Cancer Mother 50    ovarian cancer  . Hyperlipidemia Mother   . Anxiety disorder Mother   . Depression Mother   . Arthritis Father   . Anxiety disorder Father   . Diabetes Father   . Cirrhosis Brother     hep c  . Heart disease Maternal Grandmother   . Heart disease Maternal Grandfather    History  Substance Use Topics  . Smoking status: Never Smoker   . Smokeless tobacco: Never Used  . Alcohol Use: Yes     Comment: rarely     Review of Systems  Constitutional: Negative for fever, chills, activity change and appetite change.  HENT: Negative for congestion, ear discharge, ear pain, postnasal drip, rhinorrhea and sinus pressure.   Eyes: Negative for discharge and itching.  Respiratory: Negative for cough, chest tightness, shortness of breath and wheezing.   Cardiovascular: Negative for chest pain, palpitations and leg swelling.  Gastrointestinal: Negative for nausea, vomiting, diarrhea, constipation and abdominal distention.  Endocrine: Negative for cold intolerance and heat intolerance.  Musculoskeletal: Negative for arthralgias and joint swelling.  Skin: Negative for rash.  Neurological: Positive for tremors (chronic ). Negative for dizziness, syncope and headaches.  Psychiatric/Behavioral: Positive for sleep disturbance and dysphoric mood.  All other systems reviewed and are negative.       Objective:   Physical Exam  Vitals reviewed. Constitutional: She is oriented to person, place, and time. She  appears well-developed and well-nourished. No distress.  HENT:  Head: Normocephalic and atraumatic.  Right Ear: External ear normal.  Left Ear: External ear normal.  Nose: Nose normal.  Mouth/Throat: Oropharynx is clear and moist. No oropharyngeal exudate.  Eyes: Conjunctivae and EOM are normal. Pupils are equal, round, and reactive to light. Right eye exhibits no discharge. Left eye exhibits no discharge. No scleral icterus.  Neck: Normal  range of motion. Neck supple. No thyromegaly present.  Cardiovascular: Normal rate, regular rhythm and normal heart sounds.   No murmur heard. Pulmonary/Chest: Effort normal and breath sounds normal. No respiratory distress. She has no wheezes.  Abdominal: Soft. Bowel sounds are normal. She exhibits no distension and no mass. There is no guarding.  Musculoskeletal: Normal range of motion. She exhibits no edema and no tenderness.  Lymphadenopathy:    She has no cervical adenopathy.  Neurological: She is alert and oriented to person, place, and time.  Skin: Skin is warm and dry. No rash noted. She is not diaphoretic.  Psychiatric: Her behavior is normal. Judgment and thought content normal.  Tearful     BP Readings from Last 3 Encounters:  08/10/13 122/80  06/09/13 110/72  05/28/11 112/70   Wt Readings from Last 3 Encounters:  08/10/13 179 lb (81.194 kg)  06/09/13 169 lb (76.658 kg)  05/28/11 156 lb 12.8 oz (71.124 kg)   Lab Results  Component Value Date   WBC 5.8 04/13/2011   HGB 14.1 04/13/2011   HCT 41.2 04/13/2011   PLT 227.0 04/13/2011   GLUCOSE 97 04/13/2011   CHOL 214* 04/13/2011   TRIG 113.0 04/13/2011   HDL 91.40 04/13/2011   LDLDIRECT 107.2 04/13/2011   ALT 16 04/13/2011   AST 19 04/13/2011   NA 141 04/13/2011   K 3.8 04/13/2011   CL 105 04/13/2011   CREATININE 0.8 04/13/2011   BUN 9 04/13/2011   CO2 26 04/13/2011   TSH 1.40 04/13/2011       Assessment & Plan:   Annual wellness exam/CPX/v70.0 - Patient has been counseled on age-appropriate routine health concerns for screening and prevention. These are reviewed and up-to-date. Immunizations are up-to-date or declined. Labs ordered and reviewed.   also see problem list -

## 2013-08-10 NOTE — Assessment & Plan Note (Addendum)
History and exam most consistent with essential tremor - no red flags for other neurologic deficits, no offending meds continue low dose ext release inderal daily and continue prn 10mg  dose for breakthru symptoms

## 2013-08-10 NOTE — Assessment & Plan Note (Addendum)
Began situational following death of spouse 02-Feb-2003 - note FH anxiety; exacerbated by death of mother in 02-Feb-2012 to ovarian cancer Resumed celexa 03/2011 improvement in symptoms -but ?borderline ahendonia; now questions if dose sufficient for current symptoms.  Also continue low dose beta-blocker as for essential tremor - advised may help symptoms or may exacerbate depression overlay Pt understands and agrees to same - will call if problems Increase Celexa to 20mg  daily today - erx done Follow up in 3 months to review symptoms.

## 2013-08-10 NOTE — Assessment & Plan Note (Addendum)
Dx 1998 - stable by history The current medical regimen is effective;  continue present plan and medications. Recheck TSH today and adjust as needed Lab Results  Component Value Date   TSH 1.40 04/13/2011

## 2013-08-11 NOTE — Addendum Note (Signed)
Addended by: Deatra James on: 08/11/2013 09:02 AM   Modules accepted: Orders

## 2013-08-16 ENCOUNTER — Other Ambulatory Visit: Payer: Self-pay | Admitting: Internal Medicine

## 2013-08-19 ENCOUNTER — Other Ambulatory Visit: Payer: Self-pay | Admitting: Nurse Practitioner

## 2013-08-20 ENCOUNTER — Other Ambulatory Visit: Payer: Self-pay | Admitting: Nurse Practitioner

## 2013-08-21 MED ORDER — LEVOTHYROXINE SODIUM 112 MCG PO TABS: ORAL_TABLET | ORAL | Status: DC

## 2013-08-21 NOTE — Telephone Encounter (Signed)
Reviewed note from Dr. Felicity Coyer.  Pt did have TSH done.  Rx not written.  Will RF for year.

## 2013-09-18 ENCOUNTER — Other Ambulatory Visit: Payer: Self-pay | Admitting: Internal Medicine

## 2013-09-24 ENCOUNTER — Other Ambulatory Visit: Payer: Self-pay | Admitting: Internal Medicine

## 2013-09-28 ENCOUNTER — Encounter: Payer: Self-pay | Admitting: Internal Medicine

## 2013-09-28 ENCOUNTER — Other Ambulatory Visit: Payer: Self-pay | Admitting: *Deleted

## 2013-09-28 MED ORDER — PROPRANOLOL HCL 10 MG PO TABS: 10.0000 mg | ORAL_TABLET | Freq: Three times a day (TID) | ORAL | Status: DC

## 2013-09-28 NOTE — Telephone Encounter (Signed)
Sent email needing refill sent on her propanolol...lmb

## 2013-10-12 ENCOUNTER — Encounter: Payer: Self-pay | Admitting: Internal Medicine

## 2013-10-12 ENCOUNTER — Ambulatory Visit (INDEPENDENT_AMBULATORY_CARE_PROVIDER_SITE_OTHER): Payer: BC Managed Care – PPO | Admitting: Internal Medicine

## 2013-10-12 VITALS — BP 110/80 | HR 54 | Temp 98.7°F | Wt 174.0 lb

## 2013-10-12 DIAGNOSIS — F3289 Other specified depressive episodes: Secondary | ICD-10-CM

## 2013-10-12 DIAGNOSIS — F32A Depression, unspecified: Secondary | ICD-10-CM

## 2013-10-12 DIAGNOSIS — F329 Major depressive disorder, single episode, unspecified: Secondary | ICD-10-CM

## 2013-10-12 NOTE — Progress Notes (Signed)
Pre-visit discussion using our clinic review tool. No additional management support is needed unless otherwise documented below in the visit note.  

## 2013-10-12 NOTE — Patient Instructions (Signed)
It was good to see you today.  Medications reviewed and updated continue Celexa 20mg  daily for depression - no changes recommended at this time.  Please schedule followup in 6 months to recheck depression and medications, call sooner if problems.

## 2013-10-12 NOTE — Assessment & Plan Note (Signed)
Began situational following death of spouse Jan 02, 2003 - note FH anxiety; exacerbated by death of mother in 01-02-12 to ovarian cancer Resumed celexa 03/2011 improvement in symptoms -but increased symptoms fall 01/01/13 Increased Celexa to 20mg  daily 07/2013 - improved The current medical regimen is effective;  continue present plan and medications.

## 2013-10-12 NOTE — Progress Notes (Signed)
Subjective:    Patient ID: Katherine Garrett, female    DOB: 09/12/1966, 48 y.o.   MRN: [id number]  HPI  Patient here today for follow up.  Chronic medical issues reviewed.  benign essential tremor - dx clarified 03/2011 -  started Inderal LA 60 for same and symptoms improved but not voice change resolved - head/hands much better  associated with voice > head > B hands tremor  Onset 2007-12-27 - describes voice as "hoarseness" never goes away, worse with nervous/stressed. Not associated with sore throat, GERD symptoms or cough. Never a smoker  Increased head tremor over same time period, chronic hand tremor also worse. symptoms worse with stress and anxiety. +FH tremor but no parkinson dz. Takes inderal prn when tremor worsens. Only somewhat compliant with daily Inderal, tremors better when remembers to take.   anxiety and depression - manifest with "nervous" feelings  Onset 27-Dec-2002 - Precipitated by death of spouse to stomach cancer; associated with insomnia and motivational/physical fatigue; +situational depression but denies SI/HI  Resumed celexa 03/2011 with improvement. Mom passed away of ovarian cancer 2011/12/27 - causing worsening depressive symptoms - dose of celexa increased to 20mg  07/2013 - patient reports that increased dose is helping with mood.   Hypothyroid - dx with 2nd pregnancy 12/26/96 - previously managed by gyn. patient reports compliance with medication(s) as prescribed. Denies adverse side effects. +weight gain but no bowel change; +fatigue, chronic   Migraines - infreq episodes - no need for medications to control same. Worse with insomnia.  Past Medical History  Diagnosis Date  . Migraines   . Urine, incontinence, stress female   . Hypothyroid dx 1998  . Anxiety   . Depression   . Insomnia 2002/12/27 onset    following death of spouse  . 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  HENT: Negative.   Eyes: Negative.   Respiratory: Negative for chest tightness, shortness of breath and wheezing.   Cardiovascular: Negative for chest pain, palpitations and leg swelling.  Gastrointestinal: Negative for nausea, vomiting, diarrhea and constipation.  Endocrine: Negative for cold intolerance and heat intolerance.  Skin: Negative for rash and wound.  Neurological: Positive for tremors (bilateral hand tremors - chronic). Negative for dizziness.  Psychiatric/Behavioral: Positive for sleep disturbance (chronic). Negative for suicidal ideas.       Objective:   Physical Exam  Vitals reviewed. Constitutional: She appears well-developed and well-nourished. No distress.  HENT:  Head: Normocephalic and atraumatic.  Eyes: Conjunctivae and EOM are normal. Pupils are equal, round, and reactive to light.  Neck: Normal range of motion. Neck supple. No thyromegaly present.  Cardiovascular: Normal rate and normal heart sounds.   No murmur heard. Pulmonary/Chest: Effort normal and breath sounds normal. No respiratory distress. She has no wheezes.  Musculoskeletal: Normal range of motion. She exhibits no edema and no tenderness.  Lymphadenopathy:    She has no cervical adenopathy.  Neurological: She is alert. No cranial nerve deficit. Coordination normal.  Skin: Skin is warm and dry. No rash noted. She is not diaphoretic.  Psychiatric: She has a normal mood and affect. Her behavior is normal. Judgment and thought content normal.    Wt Readings from Last 3 Encounters:  10/12/13 174 lb (78.926 kg)  08/10/13 179 lb (81.194 kg)  06/09/13 169 lb (76.658 kg)   BP Readings from Last 3 Encounters:  10/12/13  110/80  08/10/13 122/80  06/09/13 110/72   Lab Results  Component Value Date   WBC 9.5 08/10/2013   HGB 13.9 08/10/2013   HCT 40.8 08/10/2013   PLT 255.0 08/10/2013   GLUCOSE 95 08/10/2013   CHOL 206* 08/10/2013   TRIG 133.0 08/10/2013   HDL 83.00 08/10/2013     LDLDIRECT 110.1 08/10/2013   ALT 15 08/10/2013   AST 17 08/10/2013   NA 138 08/10/2013   K 4.0 08/10/2013   CL 107 08/10/2013   CREATININE 0.9 08/10/2013   BUN 11 08/10/2013   CO2 26 08/10/2013   TSH 3.95 08/10/2013       Assessment & Plan:   See problem list

## 2014-01-02 ENCOUNTER — Other Ambulatory Visit: Payer: Self-pay | Admitting: Internal Medicine

## 2014-01-04 ENCOUNTER — Other Ambulatory Visit: Payer: Self-pay | Admitting: Internal Medicine

## 2014-05-21 ENCOUNTER — Other Ambulatory Visit: Payer: Self-pay | Admitting: Nurse Practitioner

## 2014-05-21 NOTE — Telephone Encounter (Signed)
Last AEX: 06/09/13 Last refill:06/09/13 #94 X 3 Current AEX:06/10/14  Refill ok?

## 2014-06-08 ENCOUNTER — Telehealth: Payer: Self-pay | Admitting: Nurse Practitioner

## 2014-06-08 NOTE — Telephone Encounter (Signed)
LMTCB FOR RESCHEDULED APPOINTMENT WITH P [facility]

## 2014-06-09 NOTE — Telephone Encounter (Signed)
LMTCB FOR RESCHEDULED APPOINTMENT WITH P [facility]

## 2014-06-10 ENCOUNTER — Ambulatory Visit: Payer: BC Managed Care – PPO | Admitting: Nurse Practitioner

## 2014-07-19 ENCOUNTER — Encounter: Payer: Self-pay | Admitting: Internal Medicine

## 2014-08-03 ENCOUNTER — Other Ambulatory Visit: Payer: Self-pay | Admitting: Nurse Practitioner

## 2014-08-04 NOTE — Telephone Encounter (Signed)
Last AEX 06/09/13 with Ms. Patty Last refilled: 05/21/14 #84/0rfs  Left Message To Call Back to schedule AEX

## 2014-08-10 ENCOUNTER — Other Ambulatory Visit: Payer: Self-pay | Admitting: Nurse Practitioner

## 2014-08-10 NOTE — Telephone Encounter (Signed)
Incoming Refill Request from [facility]: Viorele  Last AEX:06/09/13 Last Refill: 05/21/14 #84 X 0 Next AEX:09/30/14 Last MMG: 05/2013 Bi-Rads Neg  Ok to send in a  2 mth supply to pharmacy due to AEX being canceled? No further refills until pt has had her AEX  Please Advise

## 2014-08-11 ENCOUNTER — Encounter: Payer: Self-pay | Admitting: Obstetrics & Gynecology

## 2014-08-11 MED ORDER — DESOGESTREL-ETHINYL ESTRADIOL 0.15-0.02/0.01 MG (21/5) PO TABS: 1.0000 | ORAL_TABLET | Freq: Every day | ORAL | Status: DC

## 2014-08-11 NOTE — Telephone Encounter (Signed)
rx done after message sent through mychart.  Appt with PG scheduled 1/16.

## 2014-08-15 ENCOUNTER — Other Ambulatory Visit: Payer: Self-pay | Admitting: Internal Medicine

## 2014-09-14 ENCOUNTER — Other Ambulatory Visit: Payer: Self-pay | Admitting: Obstetrics & Gynecology

## 2014-09-14 NOTE — Telephone Encounter (Signed)
See note from 9/23 and 10/30 = she states PCP is giving her Rx for Synthroid and doing her labs.

## 2014-09-14 NOTE — Telephone Encounter (Signed)
Medication refill request: levothyroxine 152mcg Last AEX:  06/09/13 Next AEX: 09/30/14 Last MMG (if hormonal medication request): NA Refill authorized: Until AEX

## 2014-09-29 ENCOUNTER — Other Ambulatory Visit: Payer: Self-pay | Admitting: Internal Medicine

## 2014-09-30 ENCOUNTER — Encounter: Payer: Self-pay | Admitting: Nurse Practitioner

## 2014-09-30 ENCOUNTER — Ambulatory Visit (INDEPENDENT_AMBULATORY_CARE_PROVIDER_SITE_OTHER): Payer: BLUE CROSS/BLUE SHIELD | Admitting: Nurse Practitioner

## 2014-09-30 VITALS — BP 122/80 | HR 70 | Resp 18 | Ht 65.0 in | Wt 184.8 lb

## 2014-09-30 DIAGNOSIS — Z0000 Encounter for general adult medical examination without abnormal findings: Secondary | ICD-10-CM

## 2014-09-30 DIAGNOSIS — Z01419 Encounter for gynecological examination (general) (routine) without abnormal findings: Secondary | ICD-10-CM

## 2014-09-30 DIAGNOSIS — Z1211 Encounter for screening for malignant neoplasm of colon: Secondary | ICD-10-CM

## 2014-09-30 LAB — POCT URINALYSIS DIPSTICK
Leukocytes, UA: NEGATIVE
Urobilinogen, UA: NEGATIVE
pH, UA: 5

## 2014-09-30 MED ORDER — DESOGESTREL-ETHINYL ESTRADIOL 0.15-0.02/0.01 MG (21/5) PO TABS: 1.0000 | ORAL_TABLET | Freq: Every day | ORAL | Status: DC

## 2014-09-30 NOTE — Progress Notes (Signed)
49 y.o. G11P2010 Widowed  Caucasian Fe here for annual exam.  Menses regular 4-5 days. Not dating currently or SA X 3 years.  Doing refresher course for nursing.  Youngest will be graduating this year.   Patient's last menstrual period was 09/05/2014.          Sexually active: No.  The current method of family planning is OCP (estrogen/progesterone).    Exercising: Yes.    walk, gym sporadically  Smoker:  no  Health Maintenance: Pap:  06/06/12 NEG HR HPV negative MMG:  06/01/13 Bi-Rads 1: Negative Colonoscopy:  Never had one  TDaP:  Within 10 years Labs: Hgb: 13.9 ; Urine: Negative   reports that she has never smoked. She has never used smokeless tobacco. She reports that she drinks about 0.6 oz of alcohol per week. She reports that she does not use illicit drugs.  Past Medical History  Diagnosis Date  . Migraines   . Urine, incontinence, stress female   . Hypothyroid dx 1998  . Anxiety   . Depression   . Insomnia 01-14-2003 onset    following death of spouse  . 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 Tonsillectomy  1970  . Child birth  98 & 90    x's 2    Current Outpatient Prescriptions  Medication Sig Dispense Refill  . citalopram (CELEXA) 20 MG tablet Take 1 tablet (20 mg total) by mouth daily. 30 tablet 10  . desogestrel-ethinyl estradiol (VIORELE) 0.15-0.02/0.01 MG (21/5) tablet Take 1 tablet by mouth daily. 3 Package 3  . levothyroxine (SYNTHROID, LEVOTHROID) 112 MCG tablet TAKE 1 TABLET BY MOUTH EVERY DAY 30 tablet 12  . propranolol (INDERAL) 10 MG tablet TAKE 1 TABLET (10 MG TOTAL) BY MOUTH 3 (THREE) TIMES DAILY. 90 tablet 5  . propranolol ER (INDERAL LA) 60 MG 24 hr capsule TAKE ONE CAPSULE BY MOUTH EVERY DAY 30 capsule 5   No current facility-administered medications for this visit.    Family History  Problem Relation Age of Onset  . Arthritis Mother   . Ovarian cancer Mother   . Cancer Mother  66    ovarian cancer  . Hyperlipidemia Mother   . Anxiety disorder Mother   . Depression Mother   . Arthritis Father   . Anxiety disorder Father   . Diabetes Father   . Cirrhosis Brother     hep c  . Heart disease Maternal Grandmother   . Heart disease Maternal Grandfather     ROS:  Pertinent items are noted in HPI.  Otherwise, a comprehensive ROS was negative.  Exam:   BP 122/80 mmHg  Pulse 70  Resp 18  Ht 5\' 5"  (1.651 m)  Wt 184 lb 12.8 oz (83.825 kg)  BMI 30.75 kg/m2  LMP 09/05/2014 Height: 5\' 5"  (165.1 cm) Ht Readings from Last 3 Encounters:  09/30/14 5\' 5"  (1.651 m)  08/10/13 5' 4.5" (1.638 m)  06/09/13 5' 4.5" (1.638 m)    General appearance: alert, cooperative and appears stated age Head: Normocephalic, without obvious abnormality, atraumatic Neck: no adenopathy, supple, symmetrical, trachea midline and thyroid normal to inspection and palpation Lungs: clear to auscultation bilaterally Breasts: normal appearance, no masses or tenderness Heart: regular rate and rhythm Abdomen: soft, non-tender; no masses,  no organomegaly Extremities: extremities normal, atraumatic, no cyanosis or edema Skin: Skin color, texture, turgor normal. No rashes or lesions Lymph nodes: Cervical, supraclavicular,  and axillary nodes normal. No abnormal inguinal nodes palpated Neurologic: Grossly normal   Pelvic: External genitalia:  no lesions              Urethra:  normal appearing urethra with no masses, tenderness or lesions              Bartholin's and Skene's: normal                 Vagina: normal appearing vagina with normal color and discharge, no lesions              Cervix: anteverted              Pap taken: Yes.   Bimanual Exam:  Uterus:  normal size, contour, position, consistency, mobility, non-tender              Adnexa: no mass, fullness, tenderness               Rectovaginal: Confirms               Anus:  normal sphincter tone, no lesions  Chaperone present: Yes  A:   Well Woman with normal exam  History of hypothyroid OCP for regulation of menses   P:   Reviewed health and wellness pertinent to exam  Pap smear taken today  Mammogram is due now and will schedule  Will get GI referral with Dr. Collene Mares for screening colonoscopy  Counseled on breast self exam, mammography screening, use and side effects of OCP's, adequate intake of calcium and vitamin D, diet and exercise return annually or prn  An After Visit Summary was printed and given to the patient.

## 2014-09-30 NOTE — Patient Instructions (Signed)

## 2014-10-01 LAB — HEMOGLOBIN, FINGERSTICK: Hemoglobin, fingerstick: 13.9 g/dL (ref 12.0–16.0)

## 2014-10-03 NOTE — Progress Notes (Signed)
Encounter reviewed by Dr. Dejha King Silva.  

## 2014-10-04 LAB — IPS PAP TEST WITH HPV

## 2014-10-07 ENCOUNTER — Telehealth: Payer: Self-pay | Admitting: Nurse Practitioner

## 2014-10-07 NOTE — Telephone Encounter (Signed)
Patient is calling for recent pap results. Patient thinks PG"s nurse just tried to call her. No TC note.

## 2014-10-07 NOTE — Telephone Encounter (Signed)
Left message to call [place] at [phone].  Notes Recorded by Olga Millers, [place] on 10/04/2014 at 5:34 PM AEX scheduled for 10/04/15 Notes Recorded by Milford Cage, FNP on 10/04/2014 at 2:17 PM pap02

## 2014-10-08 ENCOUNTER — Other Ambulatory Visit: Payer: Self-pay | Admitting: Nurse Practitioner

## 2014-10-11 NOTE — Telephone Encounter (Signed)
Medication refill request: Viorele  Last AEX:  09/30/14 Next AEX:  Last MMG (if hormonal medication request): 06/01/13 BIRADS1:Neg Refill authorized: 09/30/14 #3packs/3R to [facility].   - Rx denied.

## 2014-10-12 NOTE — Telephone Encounter (Signed)
Spoke with patient. Advised patient of results as seen below. Patient is agreeable and verbalizes understanding.  Routing to provider for final review. Patient agreeable to disposition. Will close encounter

## 2014-10-19 ENCOUNTER — Other Ambulatory Visit: Payer: Self-pay | Admitting: Obstetrics & Gynecology

## 2014-10-20 ENCOUNTER — Encounter: Payer: Self-pay | Admitting: Internal Medicine

## 2014-10-20 ENCOUNTER — Other Ambulatory Visit: Payer: Self-pay | Admitting: Internal Medicine

## 2014-10-20 ENCOUNTER — Other Ambulatory Visit: Payer: Self-pay | Admitting: *Deleted

## 2014-10-20 MED ORDER — LEVOTHYROXINE SODIUM 112 MCG PO TABS: ORAL_TABLET | ORAL | Status: DC

## 2014-10-20 MED ORDER — PROPRANOLOL HCL ER 60 MG PO CP24: 60.0000 mg | ORAL_CAPSULE | Freq: Every day | ORAL | Status: DC

## 2014-10-20 NOTE — Telephone Encounter (Signed)
Sent email needing refill on her propanolol & levothyroxine. CPX not until May 11th...Johny Chess

## 2014-10-20 NOTE — Telephone Encounter (Signed)
Medication refill request: Synthroid 112 mcg Last AEX: 09/30/14 PG Next AEX: 10/04/15 Last MMG (if hormonal medication request): 06/01/13 BIRADS1:neg Refill authorized: 08/21/13 #30/12R. Today #30/12R?

## 2014-11-20 ENCOUNTER — Encounter: Payer: Self-pay | Admitting: Nurse Practitioner

## 2014-12-26 ENCOUNTER — Other Ambulatory Visit: Payer: Self-pay | Admitting: Internal Medicine

## 2015-01-26 ENCOUNTER — Ambulatory Visit: Payer: Self-pay | Admitting: Internal Medicine

## 2015-03-29 ENCOUNTER — Other Ambulatory Visit: Payer: Self-pay | Admitting: Internal Medicine

## 2015-05-12 ENCOUNTER — Other Ambulatory Visit (INDEPENDENT_AMBULATORY_CARE_PROVIDER_SITE_OTHER): Payer: BLUE CROSS/BLUE SHIELD

## 2015-05-12 ENCOUNTER — Encounter: Payer: Self-pay | Admitting: Internal Medicine

## 2015-05-12 ENCOUNTER — Ambulatory Visit (INDEPENDENT_AMBULATORY_CARE_PROVIDER_SITE_OTHER): Payer: BLUE CROSS/BLUE SHIELD | Admitting: Internal Medicine

## 2015-05-12 VITALS — BP 110/80 | HR 64 | Temp 97.9°F | Ht 65.0 in | Wt 186.5 lb

## 2015-05-12 DIAGNOSIS — Z114 Encounter for screening for human immunodeficiency virus [HIV]: Secondary | ICD-10-CM | POA: Diagnosis not present

## 2015-05-12 DIAGNOSIS — E039 Hypothyroidism, unspecified: Secondary | ICD-10-CM

## 2015-05-12 DIAGNOSIS — G250 Essential tremor: Secondary | ICD-10-CM

## 2015-05-12 DIAGNOSIS — Z1159 Encounter for screening for other viral diseases: Secondary | ICD-10-CM | POA: Diagnosis not present

## 2015-05-12 DIAGNOSIS — Z0000 Encounter for general adult medical examination without abnormal findings: Secondary | ICD-10-CM | POA: Diagnosis not present

## 2015-05-12 DIAGNOSIS — E669 Obesity, unspecified: Secondary | ICD-10-CM

## 2015-05-12 LAB — URINALYSIS, ROUTINE W REFLEX MICROSCOPIC
Bilirubin Urine: NEGATIVE
Hgb urine dipstick: NEGATIVE
Ketones, ur: NEGATIVE
Leukocytes, UA: NEGATIVE
Nitrite: NEGATIVE
RBC / HPF: NONE SEEN (ref 0–?)
Specific Gravity, Urine: 1.025 (ref 1.000–1.030)
Total Protein, Urine: NEGATIVE
Urine Glucose: NEGATIVE
Urobilinogen, UA: 0.2 (ref 0.0–1.0)
WBC, UA: NONE SEEN (ref 0–?)
pH: 5.5 (ref 5.0–8.0)

## 2015-05-12 LAB — CBC WITH DIFFERENTIAL/PLATELET
Basophils Absolute: 0 10*3/uL (ref 0.0–0.1)
Basophils Relative: 0.6 % (ref 0.0–3.0)
Eosinophils Absolute: 0.1 10*3/uL (ref 0.0–0.7)
Eosinophils Relative: 1.3 % (ref 0.0–5.0)
HCT: 42.2 % (ref 36.0–46.0)
Hemoglobin: 14.3 g/dL (ref 12.0–15.0)
Lymphocytes Relative: 34.3 % (ref 12.0–46.0)
Lymphs Abs: 2.7 10*3/uL (ref 0.7–4.0)
MCHC: 34 g/dL (ref 30.0–36.0)
MCV: 89.3 fl (ref 78.0–100.0)
Monocytes Absolute: 0.4 10*3/uL (ref 0.1–1.0)
Monocytes Relative: 5.5 % (ref 3.0–12.0)
Neutro Abs: 4.5 10*3/uL (ref 1.4–7.7)
Neutrophils Relative %: 58.3 % (ref 43.0–77.0)
Platelets: 239 10*3/uL (ref 150.0–400.0)
RBC: 4.72 Mil/uL (ref 3.87–5.11)
RDW: 14 % (ref 11.5–15.5)
WBC: 7.7 10*3/uL (ref 4.0–10.5)

## 2015-05-12 LAB — BASIC METABOLIC PANEL
BUN: 14 mg/dL (ref 6–23)
CO2: 27 mEq/L (ref 19–32)
Calcium: 9.5 mg/dL (ref 8.4–10.5)
Chloride: 106 mEq/L (ref 96–112)
Creatinine, Ser: 0.95 mg/dL (ref 0.40–1.20)
GFR: 65.78 mL/min (ref >60.00)
Glucose, Bld: 94 mg/dL (ref 70–99)
Potassium: 4.8 mEq/L (ref 3.5–5.1)
Sodium: 140 mEq/L (ref 135–145)

## 2015-05-12 LAB — HEPATIC FUNCTION PANEL
ALT: 13 U/L (ref 0–35)
AST: 16 U/L (ref 0–37)
Albumin: 4 g/dL (ref 3.5–5.2)
Alkaline Phosphatase: 45 U/L (ref 39–117)
Bilirubin, Direct: 0.1 mg/dL (ref 0.0–0.3)
Total Bilirubin: 0.4 mg/dL (ref 0.2–1.2)
Total Protein: 7.4 g/dL (ref 6.0–8.3)

## 2015-05-12 LAB — LIPID PANEL
Cholesterol: 201 mg/dL — ABNORMAL HIGH (ref 0–200)
HDL: 85.9 mg/dL (ref >39.00)
LDL Cholesterol: 93 mg/dL (ref 0–99)
NonHDL: 115.06
Total CHOL/HDL Ratio: 2
Triglycerides: 108 mg/dL (ref 0.0–149.0)
VLDL: 21.6 mg/dL (ref 0.0–40.0)

## 2015-05-12 LAB — TSH: TSH: 5.35 u[IU]/mL — ABNORMAL HIGH (ref 0.35–4.50)

## 2015-05-12 MED ORDER — CITALOPRAM HYDROBROMIDE 20 MG PO TABS: 20.0000 mg | ORAL_TABLET | Freq: Every day | ORAL | Status: DC

## 2015-05-12 MED ORDER — PROPRANOLOL HCL 10 MG PO TABS: 10.0000 mg | ORAL_TABLET | Freq: Three times a day (TID) | ORAL | Status: DC

## 2015-05-12 MED ORDER — PROPRANOLOL HCL ER 60 MG PO CP24: 60.0000 mg | ORAL_CAPSULE | Freq: Every day | ORAL | Status: DC

## 2015-05-12 NOTE — Addendum Note (Signed)
Addended by: Gwendolyn Grant A on: 05/12/2015 12:21 PM   Modules accepted: Orders, SmartSet

## 2015-05-12 NOTE — Assessment & Plan Note (Signed)
Dx 1998 - stable by history The current medical regimen is effective;  continue present plan and medications. Recheck TSH today and adjust as needed Lab Results  Component Value Date   TSH 3.95 08/10/2013

## 2015-05-12 NOTE — Progress Notes (Signed)
Pre visit review using our clinic review tool, if applicable. No additional management support is needed unless otherwise documented below in the visit note. 

## 2015-05-12 NOTE — Assessment & Plan Note (Signed)
Wt Readings from Last 3 Encounters:  05/12/15 186 lb 8 oz (84.596 kg)  09/30/14 184 lb 12.8 oz (83.825 kg)  10/12/13 174 lb (78.926 kg)   Body mass index is 31.04 kg/(m^2). The patient is asked to make an attempt to improve diet and exercise patterns to aid in medical management of this problem. Also reviewed potential prescription options for assistance with weight management

## 2015-05-12 NOTE — Assessment & Plan Note (Signed)
History and exam most consistent with essential tremor - no red flags for other neurologic deficits, no offending meds continue low dose ext release inderal daily and continue prn 10mg  dose for breakthru symptoms  Patient will let us know if new neuro opinion is desired for consideration of alternative medical (not surgical) therapies

## 2015-05-12 NOTE — Progress Notes (Signed)
Subjective:    Patient ID: Katherine Garrett, female    DOB: [birth year], 49 y.o.   MRN: [id number]  HPI  patient is here today for annual physical. Patient feels well and has no complaints. Also reviewed chronic medical conditions, interval events and current concerns  Past Medical History  Diagnosis Date  . Migraines   . Urine, incontinence, stress female   . Hypothyroid dx 1998  . Anxiety   . Depression   . Insomnia 01/05/2003 onset    following death of spouse  . 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 Arthritis Mother   . Ovarian cancer Mother 50  . Hyperlipidemia Mother   . Anxiety disorder Mother   . Depression Mother   . Arthritis Father   . Anxiety disorder Father   . Diabetes Father 54    type 2  . Cirrhosis Brother     hep c  . Heart disease Maternal Grandmother   . Heart disease Maternal Grandfather    Social History  Substance Use Topics  . Smoking status: Never Smoker   . Smokeless tobacco: Never Used  . Alcohol Use: 0.6 oz/week    1 Standard drinks or equivalent per week     Comment: rarely    Review of Systems  Constitutional: Positive for fatigue and unexpected weight change (gain despite exercise).  HENT: Positive for hearing loss (long standing issue w/o change).   Respiratory: Negative for cough, shortness of breath and wheezing.   Cardiovascular: Negative for chest pain, palpitations and leg swelling.  Gastrointestinal: Negative for nausea, abdominal pain and diarrhea.  Musculoskeletal: Arthralgias: R knee crepitus - no swelling - uses OTC brace prn.  Neurological: Negative for dizziness, weakness, light-headedness and headaches.  Psychiatric/Behavioral: Negative for dysphoric mood. The patient is not nervous/anxious.   All other systems reviewed and are negative.      Objective:    Physical Exam  Constitutional: She is oriented to person, place, and time. She appears  well-developed and well-nourished. No distress.  obese  HENT:  Head: Normocephalic and atraumatic.  Right Ear: External ear normal.  Left Ear: External ear normal.  Nose: Nose normal.  Mouth/Throat: Oropharynx is clear and moist. No oropharyngeal exudate.  Eyes: EOM are normal. Pupils are equal, round, and reactive to light. Right eye exhibits no discharge. Left eye exhibits no discharge. No scleral icterus.  Neck: Normal range of motion. Neck supple. No JVD present. No tracheal deviation present. No thyromegaly present.  Cardiovascular: Normal rate, regular rhythm, normal heart sounds and intact distal pulses.  Exam reveals no friction rub.   No murmur heard. Pulmonary/Chest: Effort normal and breath sounds normal. No respiratory distress. She has no wheezes. She has no rales. She exhibits no tenderness.  Abdominal: Soft. Bowel sounds are normal. She exhibits no distension and no mass. There is no tenderness. There is no rebound and no guarding.  Genitourinary:  Defer to gyn  Musculoskeletal: Normal range of motion.  No gross deformities  Lymphadenopathy:    She has no cervical adenopathy.  Neurological: She is alert and oriented to person, place, and time. She has normal reflexes. No cranial nerve deficit.  Skin: Skin is warm and dry. No rash noted. She is not diaphoretic. No erythema.  Psychiatric: She has a normal mood and affect. Her behavior is normal. Judgment and thought content normal.  Nursing note and vitals reviewed.  BP 110/80 mmHg  Pulse 64  Temp(Src) 97.9 F (36.6 C) (Oral)  Ht 5\' 5"  (1.651 m)  Wt 186 lb 8 oz (84.596 kg)  BMI 31.04 kg/m2  SpO2 96%  LMP 04/21/2015 Wt Readings from Last 3 Encounters:  05/12/15 186 lb 8 oz (84.596 kg)  09/30/14 184 lb 12.8 oz (83.825 kg)  10/12/13 174 lb (78.926 kg)    Lab Results  Component Value Date   WBC 9.5 08/10/2013   HGB 13.9 08/10/2013   HCT 40.8 08/10/2013   PLT 255.0 08/10/2013   GLUCOSE 95 08/10/2013   CHOL  206* 08/10/2013   TRIG 133.0 08/10/2013   HDL 83.00 08/10/2013   LDLDIRECT 110.1 08/10/2013   ALT 15 08/10/2013   AST 17 08/10/2013   NA 138 08/10/2013   K 4.0 08/10/2013   CL 107 08/10/2013   CREATININE 0.9 08/10/2013   BUN 11 08/10/2013   CO2 26 08/10/2013   TSH 3.95 08/10/2013    Mm Digital Screening  06/01/2013   *RADIOLOGY REPORT*  Clinical Data: Screening.  DIGITAL SCREENING BILATERAL MAMMOGRAM WITH CAD  Comparison: None.  FINDINGS:  ACR Breast Density Category c:  The breast tissue is heterogeneously dense, which may obscure small masses.  There is no suspicious dominant mass, architectural distortion, or calcification to suggest malignancy.  Images were processed with CAD.  IMPRESSION: No mammographic evidence of malignancy.  A result letter of this screening mammogram will be mailed directly to the patient.  RECOMMENATION: Screening mammogram in one year. (Code:SM-B-01Y)  BI-RADS CATEGORY 1:  Negative.   Original Report Authenticated By: Ulyess Blossom, M.D.       Assessment & Plan:   CPX/z00.00 - Patient has been counseled on age-appropriate routine health concerns for screening and prevention. These are reviewed and up-to-date. Immunizations are up-to-date or declined. Labs ordered and reviewed.  Problem List Items Addressed This Visit    Benign head tremor    History and exam most consistent with essential tremor - no red flags for other neurologic deficits, no offending meds continue low dose ext release inderal daily and continue prn 10mg  dose for breakthru symptoms  Patient will let us know if new neuro opinion is desired for consideration of alternative medical (not surgical) therapies      Hypothyroid    Dx 1998 - stable by history The current medical regimen is effective;  continue present plan and medications. Recheck TSH today and adjust as needed Lab Results  Component Value Date   TSH 3.95 08/10/2013        Relevant Medications   propranolol (INDERAL)  10 MG tablet   propranolol ER (INDERAL LA) 60 MG 24 hr capsule   Other Relevant Orders   TSH   Obese    Wt Readings from Last 3 Encounters:  05/12/15 186 lb 8 oz (84.596 kg)  09/30/14 184 lb 12.8 oz (83.825 kg)  10/12/13 174 lb (78.926 kg)   Body mass index is 31.04 kg/(m^2). The patient is asked to make an attempt to improve diet and exercise patterns to aid in medical management of this problem. Also reviewed potential prescription options for assistance with weight management       Other Visit Diagnoses    Routine general medical examination at a health care facility    -  Primary    Relevant Orders    Basic metabolic panel    CBC with Differential/Platelet    Hepatic function panel    Lipid panel    TSH  Urinalysis, Routine w reflex microscopic (not at [facility])        Gwendolyn Grant, MD

## 2015-05-12 NOTE — Patient Instructions (Signed)
It was good to see you today.  We have reviewed your prior records including labs and tests today  Health Maintenance reviewed - complete your colonoscopy screening as discussed when finances are arranged. All other recommended immunizations and age-appropriate screenings are up-to-date.  Test(s) ordered today. Your results will be released to [place] (or called to you) after review, usually within 72hours after test completion. If any changes need to be made, you will be notified at that same time.  Medications reviewed and updated, no changes recommended at this time. Refill on medication(s) as discussed today.  Work on lifestyle changes as discussed (low fat, low carb, increased protein diet; improved exercise efforts; weight loss) to control sugar, blood pressure and cholesterol levels and/or reduce risk of developing other medical problems. Look into http://vang.com/ or other type of food journal to assist you in this process.  After review of your labs, we can consider medications to assist you with weight loss including potential Phentermine, Qsymia, Belviq, Contrave or Saxenda - please research and let me know your thoughts on what might be right for you.  Also let me know when you are ready for evaluation by orthopedic for your knee, neurology for your tremor or audiology for hearing loss  Please schedule followup in 12 months for annual exam and labs, call sooner if problems.

## 2015-05-13 LAB — HIV ANTIBODY (ROUTINE TESTING W REFLEX): HIV 1&2 Ab, 4th Generation: NONREACTIVE

## 2015-05-13 LAB — HEPATITIS C ANTIBODY: HCV Ab: NEGATIVE

## 2015-05-20 ENCOUNTER — Encounter: Payer: Self-pay | Admitting: Internal Medicine

## 2015-05-24 ENCOUNTER — Other Ambulatory Visit: Payer: Self-pay | Admitting: Internal Medicine

## 2015-05-24 DIAGNOSIS — E039 Hypothyroidism, unspecified: Secondary | ICD-10-CM

## 2015-05-24 MED ORDER — LEVOTHYROXINE SODIUM 125 MCG PO TABS: 125.0000 ug | ORAL_TABLET | Freq: Every day | ORAL | Status: DC

## 2015-07-25 ENCOUNTER — Other Ambulatory Visit: Payer: Self-pay | Admitting: Internal Medicine

## 2015-08-24 ENCOUNTER — Other Ambulatory Visit: Payer: Self-pay | Admitting: Internal Medicine

## 2015-09-09 ENCOUNTER — Other Ambulatory Visit: Payer: Self-pay | Admitting: Nurse Practitioner

## 2015-09-09 NOTE — Telephone Encounter (Signed)
She can have 1 pack only needs Mammo before another refill.

## 2015-09-09 NOTE — Telephone Encounter (Signed)
Called left message for patient to call back about refill.  [facility] approved only 1 refill, but will need to get MMG before getting any more refills. //kg

## 2015-09-09 NOTE — Telephone Encounter (Signed)
Medication refill request: Viorele 0.15-0.02/0.01 mg Last AEX:  09/30/2014 PG Next AEX: 10/04/2015 PG Last MMG (if hormonal medication request): BIRADS category 1 Negative Refill authorized: 09/30/14 #84 tabs 3 Refills  Today: #28 tabs 0 Refills ?

## 2015-10-04 ENCOUNTER — Ambulatory Visit: Payer: BLUE CROSS/BLUE SHIELD | Admitting: Nurse Practitioner

## 2015-10-06 ENCOUNTER — Other Ambulatory Visit: Payer: Self-pay | Admitting: Nurse Practitioner

## 2015-10-11 ENCOUNTER — Other Ambulatory Visit: Payer: Self-pay | Admitting: Nurse Practitioner

## 2015-11-15 ENCOUNTER — Encounter: Payer: Self-pay | Admitting: Nurse Practitioner

## 2015-11-15 ENCOUNTER — Telehealth: Payer: Self-pay

## 2015-11-15 ENCOUNTER — Other Ambulatory Visit: Payer: Self-pay | Admitting: Nurse Practitioner

## 2015-11-15 NOTE — Telephone Encounter (Signed)
Non-Urgent Medical Question  Message [phone]   From  KAELEEN POWELL   To  Kem Boroughs, FNP   Sent  11/15/2015 12:26 PM     Wallace Cullens  I have rescheduled my annual visit with you for May 23. I have started new job at [employer] and needed new insurance to kick in. Can you please refill my BCP up til then?   Thanks Hinton Dyer      Responsible Party    Pool - [facility] Pool No one has taken responsibility for this message.     No actions have been taken on this message.   Patient's last aex was on 09/30/2014. Next aex is scheduled for 02/07/2016. Last mammogram was performed at the [facility] on 06/01/2013 Birads 1: negative. Patient is requesting refill on Viorelle .15-.02/.01 mg tablets until her aex as she started a new job and is waiting for her new insurance to begin.

## 2015-11-15 NOTE — Telephone Encounter (Signed)
Telephone encounter created to review mychart message with Kem Boroughs, FNP.

## 2015-11-16 NOTE — Telephone Encounter (Signed)
Patient needs mammogram done. Last one noted was 2014, has she had one recently

## 2015-11-16 NOTE — Telephone Encounter (Addendum)
Left message to call [place] at [phone].  Mychart message also sent to patient to discuss recommendations.

## 2015-11-21 NOTE — Telephone Encounter (Signed)
RE: RE: Non-Urgent Medical Question     From   Eulas Post    To   Kem Boroughs, FNP    Sent   11/16/2015 1:38 PM       Ladell Heads   I have not had another mammogram. I just started a new job with new insurance so it may be a little while before I can get one. If at all possible could you please ask Precious Bard to renew before...Marland KitchenMarland Kitchenthese hot flashes are awful!! I promise to get one on the books.   Thank you        Routing mychart response from patient to Kem Boroughs, [place] for review and advise.

## 2015-11-21 NOTE — Telephone Encounter (Signed)
OK for a refill since her AEX is scheduled.

## 2015-11-22 MED ORDER — DESOGESTREL-ETHINYL ESTRADIOL 0.15-0.02/0.01 MG (21/5) PO TABS: 1.0000 | ORAL_TABLET | Freq: Every day | ORAL | Status: DC

## 2015-11-22 NOTE — Telephone Encounter (Signed)
I have sent in rx for Viorele .15-.02/.01 mg tablets #28 2RF until patient's next aex on 02/07/2016. I have sent her a mychart message to let her know this has been performed.  Routing to provider for final review. Patient agreeable to disposition. Will close encounter.

## 2016-01-12 ENCOUNTER — Encounter: Payer: Self-pay | Admitting: Internal Medicine

## 2016-02-07 ENCOUNTER — Ambulatory Visit: Payer: BLUE CROSS/BLUE SHIELD | Admitting: Nurse Practitioner

## 2016-02-15 ENCOUNTER — Encounter: Payer: Self-pay | Admitting: Nurse Practitioner

## 2016-02-15 ENCOUNTER — Ambulatory Visit (INDEPENDENT_AMBULATORY_CARE_PROVIDER_SITE_OTHER): Payer: 59 | Admitting: Nurse Practitioner

## 2016-02-15 VITALS — BP 100/66 | HR 72 | Ht 64.0 in | Wt 190.0 lb

## 2016-02-15 DIAGNOSIS — Z01419 Encounter for gynecological examination (general) (routine) without abnormal findings: Secondary | ICD-10-CM | POA: Diagnosis not present

## 2016-02-15 DIAGNOSIS — Z0000 Encounter for general adult medical examination without abnormal findings: Secondary | ICD-10-CM | POA: Diagnosis not present

## 2016-02-15 DIAGNOSIS — E039 Hypothyroidism, unspecified: Secondary | ICD-10-CM | POA: Diagnosis not present

## 2016-02-15 LAB — TSH: TSH: 3.02 mIU/L

## 2016-02-15 MED ORDER — CITALOPRAM HYDROBROMIDE 20 MG PO TABS: 20.0000 mg | ORAL_TABLET | Freq: Every day | ORAL | Status: DC

## 2016-02-15 MED ORDER — DESOGESTREL-ETHINYL ESTRADIOL 0.15-0.02/0.01 MG (21/5) PO TABS: 1.0000 | ORAL_TABLET | Freq: Every day | ORAL | Status: DC

## 2016-02-15 NOTE — Progress Notes (Signed)
Patient ID: Katherine Garrett, female   DOB: [birth year], 50 y.o.   MRN: [id number]  50 y.o. [id number] Widowed  Caucasian Fe here for annual exam.  Menses now at 4 days moderate to light.  Now dating but not SA.  Recent increase in synthroid last 04/2015.  Needs to recheck of TSH and adjustment as needed.  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  Oldest son graduated from college.  Youngest just finished first year.  Patient's last menstrual period was 01/14/2016 (exact date).          Sexually active: No.  The current method of family planning is abstinence.    Exercising: Yes.    walking and weights Smoker:  no  Health Maintenance: Pap:09/30/14, negative with negative HR HPV MMG: 06/01/13, Bi-Rads 1: Negative will schedule in June with 3 D Colonoscopy: Never - will reschedule TDaP:05/28/11  Hep C and HIV: 05/12/15 Labs: PCP in EPIC   reports that she has never smoked. She has never used smokeless tobacco. She reports that she drinks about 0.6 oz of alcohol per week. She reports that she does not use illicit drugs.  Past Medical History  Diagnosis Date  . Migraines   . Urine, incontinence, stress female   . Hypothyroid dx 1998  . Anxiety   . Depression   . Insomnia 06-Jan-2003 onset    following death of spouse  . 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 Tonsillectomy  1970  . Child birth  29 & 73    x's 2    Current Outpatient Prescriptions  Medication Sig Dispense Refill  . citalopram (CELEXA) 20 MG tablet Take 1 tablet (20 mg total) by mouth daily. 30 tablet 5  . desogestrel-ethinyl estradiol (VIORELE) 0.15-0.02/0.01 MG (21/5) tablet Take 1 tablet by mouth daily. 3 Package 4  . levothyroxine (SYNTHROID) 125 MCG tablet Take 1 tablet (125 mcg total) by mouth daily before breakfast. 90 tablet 3  . propranolol (INDERAL) 10 MG tablet Take 1 tablet (10 mg total) by mouth 3 (three) times daily. 90 tablet  5  . propranolol ER (INDERAL LA) 60 MG 24 hr capsule Take 1 capsule (60 mg total) by mouth daily. 30 capsule 5   No current facility-administered medications for this visit.    Family History  Problem Relation Age of Onset  . Arthritis Mother   . Ovarian cancer Mother 9  . Hyperlipidemia Mother   . Anxiety disorder Mother   . Depression Mother   . Arthritis Father   . Anxiety disorder Father   . Diabetes Father 68    type 2  . Seizures Father   . Cirrhosis Brother     hep c  . Heart disease Maternal Grandmother   . Heart disease Maternal Grandfather     ROS:  Pertinent items are noted in HPI.  Otherwise, a comprehensive ROS was negative.  Exam:   BP 100/66 mmHg  Pulse 72  Ht 5\' 4"  (1.626 m)  Wt 190 lb (86.183 kg)  BMI 32.60 kg/m2  LMP 01/14/2016 (Exact Date) Height: 5\' 4"  (162.6 cm) Ht Readings from Last 3 Encounters:  02/15/16 5\' 4"  (1.626 m)  05/12/15 5\' 5"  (1.651 m)  09/30/14 5\' 5"  (1.651 m)    General appearance: alert, cooperative and appears stated age Head: Normocephalic, without obvious abnormality, atraumatic Neck: no adenopathy, supple, symmetrical, trachea midline  and thyroid normal to inspection and palpation Lungs: clear to auscultation bilaterally Breasts: normal appearance, no masses or tenderness Heart: regular rate and rhythm Abdomen: soft, non-tender; no masses,  no organomegaly Extremities: extremities normal, atraumatic, no cyanosis or edema Skin: Skin color, texture, turgor normal. No rashes or lesions Lymph nodes: Cervical, supraclavicular, and axillary nodes normal. No abnormal inguinal nodes palpated Neurologic: Grossly normal   Pelvic: External genitalia:  no lesions              Urethra:  normal appearing urethra with no masses, tenderness or lesions              Bartholin's and Skene's: normal                 Vagina: normal appearing vagina with normal color and discharge, no lesions              Cervix: anteverted              Pap  taken: No. Bimanual Exam:  Uterus:  normal size, contour, position, consistency, mobility, non-tender              Adnexa: no mass, fullness, tenderness               Rectovaginal: Confirms               Anus:  normal sphincter tone, no lesions  Chaperone present: no  A:  Well Woman with normal exam  History of hypothyroid OCP for regulation of menses  FMH:  Mother dc with OV cancer  Benign essential head tremor  History of depression and anxiety  P:   Reviewed health and wellness pertinent to exam  Pap smear as above  Mammogram is due now and will schedule for June  Refill on OCP for a year  Refill Celexa for a year  Follow with labs  Counseled on breast self exam, mammography screening, use and side effects of OCP's, adequate intake of calcium and vitamin D, diet and exercise return annually or prn  An After Visit Summary was printed and given to the patient.

## 2016-02-15 NOTE — Progress Notes (Signed)
Reviewed personally.  M. Suzanne Jaylean Buenaventura, MD.  

## 2016-02-15 NOTE — Patient Instructions (Signed)

## 2016-02-16 MED FILL — CITALOPRAM HBR 20 MG TABLET: 20 | 30 days supply | Qty: 30 | Fill #0

## 2016-02-16 MED FILL — VIORELE 28 DAY TABLET: 0.15-0.02/0 | 84 days supply | Qty: 84 | Fill #0

## 2016-02-16 MED FILL — SYNTHROID 125 MCG TABLET: 125 | 90 days supply | Qty: 90 | Fill #0

## 2016-02-17 ENCOUNTER — Other Ambulatory Visit: Payer: Self-pay | Admitting: Nurse Practitioner

## 2016-04-02 MED FILL — CITALOPRAM HBR 20 MG TABLET: 20 | 30 days supply | Qty: 30 | Fill #1

## 2016-05-02 MED FILL — VIORELE 28 DAY TABLET: 0.15-0.02/0 | 84 days supply | Qty: 84 | Fill #1

## 2016-05-02 MED FILL — CITALOPRAM HBR 20 MG TABLET: 20 | 30 days supply | Qty: 30 | Fill #2

## 2016-06-07 MED FILL — CITALOPRAM HBR 20 MG TABLET: 20 | 30 days supply | Qty: 30 | Fill #3

## 2016-06-07 MED FILL — SYNTHROID 125 MCG TABLET: 125 | 90 days supply | Qty: 90 | Fill #1

## 2016-07-21 MED FILL — VIORELE 28 DAY TABLET: 0.15-0.02/0 | 84 days supply | Qty: 84 | Fill #2

## 2016-07-23 MED FILL — CITALOPRAM HBR 20 MG TABLET: 20 | 30 days supply | Qty: 30 | Fill #4

## 2016-08-24 ENCOUNTER — Telehealth: Payer: Self-pay

## 2016-08-24 ENCOUNTER — Encounter: Payer: Self-pay | Admitting: Family Medicine

## 2016-08-24 ENCOUNTER — Ambulatory Visit (INDEPENDENT_AMBULATORY_CARE_PROVIDER_SITE_OTHER): Payer: 59 | Admitting: Family Medicine

## 2016-08-24 VITALS — BP 118/72 | HR 66 | Temp 98.5°F | Resp 18 | Ht 64.0 in

## 2016-08-24 DIAGNOSIS — G250 Essential tremor: Secondary | ICD-10-CM | POA: Diagnosis not present

## 2016-08-24 DIAGNOSIS — G4700 Insomnia, unspecified: Secondary | ICD-10-CM | POA: Insufficient documentation

## 2016-08-24 DIAGNOSIS — F329 Major depressive disorder, single episode, unspecified: Secondary | ICD-10-CM

## 2016-08-24 DIAGNOSIS — R5383 Other fatigue: Secondary | ICD-10-CM | POA: Diagnosis not present

## 2016-08-24 DIAGNOSIS — E039 Hypothyroidism, unspecified: Secondary | ICD-10-CM | POA: Diagnosis not present

## 2016-08-24 DIAGNOSIS — F5101 Primary insomnia: Secondary | ICD-10-CM | POA: Diagnosis not present

## 2016-08-24 DIAGNOSIS — E6609 Other obesity due to excess calories: Secondary | ICD-10-CM | POA: Diagnosis not present

## 2016-08-24 DIAGNOSIS — Z6832 Body mass index (BMI) 32.0-32.9, adult: Secondary | ICD-10-CM | POA: Diagnosis not present

## 2016-08-24 MED ORDER — BUPROPION HCL ER (XL) 150 MG PO TB24: 150.0000 mg | ORAL_TABLET | Freq: Every day | ORAL | 1 refills | Status: DC

## 2016-08-24 MED FILL — BUPROPION HCL XL 150 MG TAB: 150 | 30 days supply | Qty: 30 | Fill #0

## 2016-08-24 NOTE — Progress Notes (Signed)
Subjective:  By signing my name below, I, Katherine Garrett, attest that this documentation has been prepared under the direction and in the presence of Delman Cheadle, MD Electronically Signed: Ladene Artist, ED Scribe 08/24/2016 at 1:44 PM.   Patient ID: Katherine Garrett, female    DOB: [birth year], 50 y.o.   MRN: [id number]   Chief Complaint  Patient presents with  . Establish Care   HPI HPI Comments: Katherine Garrett is a 50 y.o. female who presents to the [facility] to establish care. Her previous PCP Gwendolyn Grant, MD moved to an administrative role with [facility].   Essential Tremor Pt takes Inderal LA 60 mg x1 daily and 10 mg x3 daily for essential tremor with improvement. Maternal grandmother has h/o ET as well. Pt has noticed some fatigue and "tremors" in her voice which is exacerbated with increased stress. She denies light-headedness and dizziness.   Depression  Pt states that she has lost her stepfather, sister, husband, brother and mother over the past 16 years which she attributes to depression. She was placed on Celexa approximately 14 years ago after her husband's death. Pt denies panic attacks. She also reports difficulty sleeping, describing her sleep pattern as erratic, since then as well. She has tried Benadryl and Tylenol PM to assist with sleep in the past.   Weight Pt states that she is at the heaviest that she has ever been, even during pregnancy. She states that she has gained ~10 lbs per year over the past few years. Pt was started on BC pills by Kem Boroughs, FNP ~3 years ago for hot flashes which she states has provided significant relief. Pt also reports a h/o hypothyroidism but states that she has been on the same level of Synthroid for a while. Pt states that she does not eat much and often skips meals.   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 She has 2 sons, age 37 and 47. Her youngest son just started college at [employer] and the oldest  graduated from [employer] 3 years ago but is still local.   Past Medical History:  Diagnosis Date  . Anxiety   . Depression   . 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 Hypothyroid dx 1998  . Insomnia 01-03-03 onset   following death of spouse  . Migraines   . 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 Urine, incontinence, stress female    Current Outpatient Prescriptions on File Prior to Visit  Medication Sig Dispense Refill  . citalopram (CELEXA) 20 MG tablet Take 1 tablet (20 mg total) by mouth daily. 30 tablet 5  . desogestrel-ethinyl estradiol (VIORELE) 0.15-0.02/0.01 MG (21/5) tablet Take 1 tablet by mouth daily. 3 Package 4  . levothyroxine (SYNTHROID) 125 MCG tablet Take 1 tablet (125 mcg total) by mouth daily before breakfast. 90 tablet 3  . propranolol (INDERAL) 10 MG tablet Take 1 tablet (10 mg total) by mouth 3 (three) times daily. 90 tablet 5  . propranolol ER (INDERAL LA) 60 MG 24 hr capsule Take 1 capsule (60 mg total) by mouth daily. 30 capsule 5   No current facility-administered medications on file prior to visit.    Allergies  Allergen Reactions  . Codeine Nausea And Vomiting   Review of Systems  Constitutional: Positive for appetite change (decrease), fatigue and unexpected weight change. Negative for activity change, chills, diaphoresis and fever.  HENT: Positive for voice change. Negative for trouble swallowing.  Musculoskeletal: Negative for neck pain and neck stiffness.  Allergic/Immunologic: Negative for immunocompromised state.  Neurological: Positive for tremors. Negative for dizziness and light-headedness.  Psychiatric/Behavioral: Positive for dysphoric mood and sleep disturbance. Negative for agitation, behavioral problems and confusion. The patient is not nervous/anxious.       Objective:   Physical Exam  Constitutional: She is oriented to person, place, and time. She appears well-developed and well-nourished. No distress.  HENT:  Head: Normocephalic and atraumatic.    Eyes: Conjunctivae and EOM are normal.  Neck: Neck supple. No tracheal deviation present.  Cardiovascular: Normal rate, regular rhythm and normal heart sounds.   Pulmonary/Chest: Effort normal and breath sounds normal. No respiratory distress.  Musculoskeletal: Normal range of motion.  Neurological: She is alert and oriented to person, place, and time.  Skin: Skin is warm and dry.  Psychiatric: She has a normal mood and affect. Her behavior is normal.  Nursing note and vitals reviewed.   BP 118/72 (BP Location: Right Arm, Patient Position: Sitting, Cuff Size: Small)   Pulse 66   Temp 98.5 F (36.9 C) (Oral)   Resp 18   Ht 5\' 4"  (1.626 m)   LMP 08/03/2016   SpO2 98%     Assessment & Plan:   1. Benign head tremor - and vocal. controlled on propranolol LA and sched tid IR propranolol but med causing fatigue. Could try primidone instead and/or add in topiramate (which might help with weight loss as well). Consider neurology eval.  2. Reactive depression - on citalopram x 14 yrs, no other meds prior.  Start wellbutrin 150 XL for energy and weight loss. If she tolerates, ok to go up to wellbutrin XL 300mg  and decrease citalopram to 10mg  for several weeks. Then try stopping citalopram.   3. Primary insomnia   4. Class 1 obesity due to excess calories without serious comorbidity with body mass index (BMI) of 32.0 to 32.9 in adult   5. Hypothyroidism, unspecified type - pt wants to try to increase dose as much as poss to help with energy/weight - tsh has always been > 3 so ok to increase to 137 from 125. Recheck in 6-8 wks  6. Fatigue, unspecified type     Orders Placed This Encounter  Procedures  . TSH  . CBC  . Comprehensive metabolic panel  . Hemoglobin A1c  . Care order/instruction:    AVS printed - let patient go!    Meds ordered this encounter  Medications  . buPROPion (WELLBUTRIN XL) 150 MG 24 hr tablet    Sig: Take 1 tablet (150 mg total) by mouth daily.    Dispense:  30  tablet    Refill:  1  . levothyroxine (SYNTHROID, LEVOTHROID) 137 MCG tablet    Sig: Take 1 tablet (137 mcg total) by mouth daily before breakfast.    Dispense:  30 tablet    Refill:  2    I personally performed the services described in this documentation, which was scribed in my presence. The recorded information has been reviewed and considered, and addended by me as needed.   Delman Cheadle, M.D.  [facility] [address] [place], [place] [address] [phone] phone [phone] fax  08/25/16 11:03 PM

## 2016-08-24 NOTE — Patient Instructions (Addendum)
     IF you received an x-ray today, you will receive an invoice from [facility]. Please contact [facility] at [phone] with questions or concerns regarding your invoice.   IF you received labwork today, you will receive an invoice from [employer]. Please contact [facility] at [phone] with questions or concerns regarding your invoice.   Our billing staff will not be able to assist you with questions regarding bills from these companies.  You will be contacted with the lab results as soon as they are available. The fastest way to get your results is to activate your My Chart account. Instructions are located on the last page of this paperwork. If you have not heard from [place] regarding the results in 2 weeks, please contact this office.    We recommend that you schedule a mammogram for breast cancer screening. Typically, you do not need a referral to do this. Please contact a local imaging center to schedule your mammogram.  [facility] - [phone]  *ask for the Radiology [place] - [phone] or [phone]  [facility] - [phone] [place] [phone] [facility] - [phone]  *ask for the [facility] - [phone]  *ask for the Radiology Department [facility] - [phone]  *ask for the [place] - [phone]   If you are not having adverse reactions to the Wellbutrin in the next several weeks then I would recommend doubling up on the Wellbutrin and cutting your citalopram and half. If you do okay on this for an additional 2-3 weeks let me know so I can adjust your doses as needed for medication refills. The next step after the Wellbutrin becomes fully effective after being on the same dose for 6 weeks would be to stop your citalopram and see how  you do.  Most common adverse reactions to Wellbutrin would be increased in anxiety and insomnia. Since you already have the latter is somewhat is not worse that's fine and we can always discuss alternative remedies in the future.  I agree it does look like you're levothyroxine dose might be able to go a little higher. As long as your TSH is in the same range as prior we'll plan to increase you to the 137 g dose and recheck again in 6-8 weeks in an office visit.

## 2016-08-24 NOTE — Telephone Encounter (Signed)
Patient called to request a refill for her propranolol prescriptions.  She was here earlier today and states that she saw Brigitte Pulse for medication refills.  She states that the pharmacy is having trouble filling her prescription and they say it needs to be sent as a new prescription.  She would like to have these filled by 6 tonight since her pharmacy will be closed over the weekend.  Please advise!  [phone]

## 2016-08-25 LAB — COMPREHENSIVE METABOLIC PANEL
ALT: 14 IU/L (ref 0–32)
AST: 16 IU/L (ref 0–40)
Albumin/Globulin Ratio: 2 (ref 1.2–2.2)
Albumin: 4.3 g/dL (ref 3.5–5.5)
Alkaline Phosphatase: 44 IU/L (ref 39–117)
BUN/Creatinine Ratio: 22 (ref 9–23)
BUN: 14 mg/dL (ref 6–24)
Bilirubin Total: <0.2 mg/dL (ref 0.0–1.2)
CO2: 24 mmol/L (ref 18–29)
Calcium: 8.9 mg/dL (ref 8.7–10.2)
Chloride: 103 mmol/L (ref 96–106)
Creatinine, Ser: 0.65 mg/dL (ref 0.57–1.00)
GFR calc Af Amer: 118 mL/min/{1.73_m2} (ref >59)
GFR calc non Af Amer: 102 mL/min/{1.73_m2} (ref >59)
Globulin, Total: 2.1 g/dL (ref 1.5–4.5)
Glucose: 83 mg/dL (ref 65–99)
Potassium: 4.6 mmol/L (ref 3.5–5.2)
Sodium: 140 mmol/L (ref 134–144)
Total Protein: 6.4 g/dL (ref 6.0–8.5)

## 2016-08-25 LAB — CBC
Hematocrit: 40.1 % (ref 34.0–46.6)
Hemoglobin: 13.6 g/dL (ref 11.1–15.9)
MCH: 30.6 pg (ref 26.6–33.0)
MCHC: 33.9 g/dL (ref 31.5–35.7)
MCV: 90 fL (ref 79–97)
Platelets: 245 10*3/uL (ref 150–379)
RBC: 4.45 x10E6/uL (ref 3.77–5.28)
RDW: 14 % (ref 12.3–15.4)
WBC: 7.3 10*3/uL (ref 3.4–10.8)

## 2016-08-25 LAB — HEMOGLOBIN A1C
Est. average glucose Bld gHb Est-mCnc: 105 mg/dL
Hgb A1c MFr Bld: 5.3 % (ref 4.8–5.6)

## 2016-08-25 LAB — TSH: TSH: 3.01 u[IU]/mL (ref 0.450–4.500)

## 2016-08-25 MED ORDER — LEVOTHYROXINE SODIUM 137 MCG PO TABS: 137.0000 ug | ORAL_TABLET | Freq: Every day | ORAL | 2 refills | Status: DC

## 2016-08-25 MED ORDER — PROPRANOLOL HCL 10 MG PO TABS: 10.0000 mg | ORAL_TABLET | Freq: Three times a day (TID) | ORAL | 5 refills | Status: DC

## 2016-08-25 MED ORDER — PROPRANOLOL HCL ER 60 MG PO CP24: 60.0000 mg | ORAL_CAPSULE | Freq: Every day | ORAL | 5 refills | Status: DC

## 2016-08-27 MED FILL — CITALOPRAM HBR 20 MG TABLET: 20 | 30 days supply | Qty: 30 | Fill #5

## 2016-08-27 MED FILL — PROPRANOLOL 10 MG TABLET: 10 | 30 days supply | Qty: 90 | Fill #0

## 2016-08-27 MED FILL — PROPRANOLOL ER 60 MG CAPSUL: 60 | 30 days supply | Qty: 30 | Fill #0

## 2016-08-27 MED FILL — SYNTHROID 137 MCG TABLET: 137 | 90 days supply | Qty: 90 | Fill #0

## 2016-08-30 NOTE — Telephone Encounter (Signed)
escribed by Delman Cheadle 12/9

## 2016-09-24 MED FILL — PROPRANOLOL 10 MG TABLET: 10 | 30 days supply | Qty: 90 | Fill #1

## 2016-09-24 MED FILL — BUPROPION HCL XL 150 MG TAB: 150 | 30 days supply | Qty: 30 | Fill #1

## 2016-09-24 MED FILL — PROPRANOLOL ER 60 MG CAP: 60 | 30 days supply | Qty: 30 | Fill #1

## 2016-10-29 MED FILL — DESOGESTR-ETH ESTRAD ETH ES: 0.15-0.02/0 | 84 days supply | Qty: 84 | Fill #3

## 2016-11-01 ENCOUNTER — Encounter: Payer: Self-pay | Admitting: Family Medicine

## 2016-11-01 ENCOUNTER — Other Ambulatory Visit: Payer: Self-pay | Admitting: Family Medicine

## 2016-11-01 MED ORDER — BUPROPION HCL ER (XL) 300 MG PO TB24: 300.0000 mg | ORAL_TABLET | Freq: Every day | ORAL | 1 refills | Status: DC

## 2016-11-02 MED FILL — BUPROPION HCL XL 300 MG TAB: 300 | 90 days supply | Qty: 90 | Fill #0

## 2016-11-28 ENCOUNTER — Other Ambulatory Visit: Payer: Self-pay | Admitting: Family Medicine

## 2016-11-29 MED FILL — SYNTHROID 137 MCG TABLET: 137 | 30 days supply | Qty: 30 | Fill #0

## 2017-01-01 MED FILL — SYNTHROID 137 MCG TABLET: 137 | 30 days supply | Qty: 30 | Fill #1

## 2017-01-02 MED FILL — PROPRANOLOL ER 60 MG CAP: 60 | 30 days supply | Qty: 30 | Fill #2

## 2017-01-15 MED FILL — PROPRANOLOL 10 MG TABLET: 10 | 30 days supply | Qty: 90 | Fill #2

## 2017-01-15 MED FILL — DESOGESTR-ETH ESTRAD ETH ES: 0.15-0.02/0 | 84 days supply | Qty: 84 | Fill #4

## 2017-02-01 MED FILL — SYNTHROID 137 MCG TABLET: 137 | 30 days supply | Qty: 30 | Fill #2

## 2017-02-14 ENCOUNTER — Encounter: Payer: Self-pay | Admitting: Nurse Practitioner

## 2017-02-14 NOTE — Progress Notes (Deleted)
Patient ID: Katherine Garrett, female   DOB: [birth year], 50 y.o.   MRN: [id number]  51 y.o. [id number] Widowed  Caucasian Fe here for annual exam.    No LMP recorded.          Sexually active: No.  The current method of family planning is {contraception:315051}.    Exercising: {yes no:314532}  {types:19826} Smoker:  no  Health Maintenance: Pap: 09/30/14, Negative with neg HR HPV  06/06/12, Negative with neg HR HPV History of Abnormal Pap: no MMG: 06/01/13, 3D-no, Density Category C, Bi-Rads 1:  Negative Self Breast exams: {YES NO:22349} Colonoscopy: *** BMD: *** T Score: *** Spine / *** Right Femur Neck / *** Left Femur Neck TDaP: 05/28/11 Hep C and HIV: 05/12/15 Labs: PCP in EPIC   reports that she has never smoked. She has never used smokeless tobacco. She reports that she drinks about 1.8 - 2.4 oz of alcohol per week . She reports that she does not use drugs.  Past Medical History:  Diagnosis Date  . Anxiety   . Depression   . 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 Hypothyroid dx 1998  . Insomnia 01-08-03 onset   following death of spouse  . Migraines   . 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 Urine, incontinence, stress female     Past Surgical History:  Procedure Laterality Date  . Child birth  38 & 54   x's 2  . TONSILLECTOMY  1970    Current Outpatient Prescriptions  Medication Sig Dispense Refill  . buPROPion (WELLBUTRIN XL) 300 MG 24 hr tablet Take 1 tablet (300 mg total) by mouth daily. 90 tablet 1  . desogestrel-ethinyl estradiol (VIORELE) 0.15-0.02/0.01 MG (21/5) tablet Take 1 tablet by mouth daily. 3 Package 4  . propranolol (INDERAL) 10 MG tablet Take 1 tablet (10 mg total) by mouth 3 (three) times daily. 90 tablet 5  . propranolol ER (INDERAL LA) 60 MG 24 hr capsule Take 1 capsule (60 mg total) by mouth daily. 30 capsule 5  . SYNTHROID 137 MCG tablet TAKE 1 TABLET BY MOUTH DAILY BEFORE BREAKFAST. 30 tablet 2   No current facility-administered medications for this visit.     Family  History  Problem Relation Age of Onset  . Arthritis Mother   . Ovarian cancer Mother 75  . Hyperlipidemia Mother   . Anxiety disorder Mother   . Depression Mother   . Arthritis Father   . Anxiety disorder Father   . Diabetes Father 30       type 2  . Seizures Father   . Cirrhosis Brother        hep c  . Heart disease Maternal Grandmother   . Heart disease Maternal Grandfather     ROS:  Pertinent items are noted in HPI.  Otherwise, a comprehensive ROS was negative.  Exam:   There were no vitals taken for this visit.   Ht Readings from Last 3 Encounters:  08/24/16 5\' 4"  (1.626 m)  02/15/16 5\' 4"  (1.626 m)  05/12/15 5\' 5"  (1.651 m)    General appearance: alert, cooperative and appears stated age Head: Normocephalic, without obvious abnormality, atraumatic Neck: no adenopathy, supple, symmetrical, trachea midline and thyroid {EXAM; THYROID:18604} Lungs: clear to auscultation bilaterally Breasts: {Exam; breast:13139::"normal appearance, no masses or tenderness"} Heart: regular rate and rhythm Abdomen: soft, non-tender; no masses,  no organomegaly Extremities: extremities normal, atraumatic, no cyanosis or edema Skin: Skin color, texture, turgor normal. No rashes or lesions Lymph nodes: Cervical,  supraclavicular, and axillary nodes normal. No abnormal inguinal nodes palpated Neurologic: Grossly normal   Pelvic: External genitalia:  no lesions              Urethra:  normal appearing urethra with no masses, tenderness or lesions              Bartholin's and Skene's: normal                 Vagina: normal appearing vagina with normal color and discharge, no lesions              Cervix: {exam; cervix:14595}              Pap taken: {yes no:314532} Bimanual Exam:  Uterus:  {exam; uterus:12215}              Adnexa: {exam; adnexa:12223}               Rectovaginal: Confirms               Anus:  normal sphincter tone, no lesions  Chaperone present: ***  A:  Well Woman with  normal exam  P:   Reviewed health and wellness pertinent to exam  Pap smear: {YES NO:22349}  {plan; gyn:5269::"mammogram","pap smear","return annually or prn"}  An After Visit Summary was printed and given to the patient.

## 2017-02-18 ENCOUNTER — Ambulatory Visit: Payer: 59 | Admitting: Nurse Practitioner

## 2017-02-22 MED FILL — BUPROPION HCL XL 300 MG TAB: 300 | 90 days supply | Qty: 90 | Fill #1

## 2017-02-25 MED FILL — PROPRANOLOL ER 60 MG CAP: 60 | 30 days supply | Qty: 30 | Fill #3

## 2017-02-25 MED FILL — PROPRANOLOL HCL 10 MG TAB: 10 | 30 days supply | Qty: 90 | Fill #3

## 2017-03-02 ENCOUNTER — Encounter: Payer: Self-pay | Admitting: Family Medicine

## 2017-03-06 ENCOUNTER — Other Ambulatory Visit: Payer: Self-pay | Admitting: *Deleted

## 2017-03-06 ENCOUNTER — Telehealth: Payer: Self-pay | Admitting: Family Medicine

## 2017-03-06 MED ORDER — SYNTHROID 137 MCG PO TABS: ORAL_TABLET | ORAL | 0 refills | Status: DC

## 2017-03-06 MED FILL — SYNTHROID 137 MCG TABLET: 137 | 30 days supply | Qty: 30 | Fill #0

## 2017-03-06 NOTE — Telephone Encounter (Signed)
MyChart message sent to pt about making an appt

## 2017-05-01 NOTE — Progress Notes (Signed)
Subjective:    Patient ID: Katherine Garrett, female    DOB: 1966/06/11, 51 y.o.   MRN: [id number] Chief Complaint  Patient presents with  . Medication Refill    synthroid, inderal and inderal LA    HPI  Katherine Garrett is a delightful 51 yo woman who is here today for medication refills - last seen 9 mos prior when she established care.  Hypothyroid: Levothyroxine dose increased to 137 from 125 at last visit 9 mos prior in effort to help with weight loss and energy. Pt asked to RTC for tsh check in 6-8 wks after dose change (which did not happen.) SHe did not notice any benefit from the levothyroxine change. Lab Results  Component Value Date   TSH 3.010 08/24/2016   TSH 3.02 02/15/2016   TSH 5.35 (H) 05/12/2015   Benign head and vocal tremor: was controlled on propranolol LA 60 and sched tid 10mg  IR propranolol but med causing fatigue. Gets worse at work when under stress.   Reactive depression - was on citalopram x 14 yrs, no other meds prior but at last visit cross-tapered off and onto Wellbutrin XL 300mg  for energy and weight loss. But she knows she is having empty next syndrome. She absolutely refuses to be weighed as she did last time and thinks she has lost a little bit of weight as close looser. Sometimes she walks at park for 2 mi and does go to [employer] for cardio 1-2x/wk. Since she is on the propranolol she thinks she has a harder time really maxing to her exercise tolerance. Fatigue: Insomnia: Still has toruble sleeping for 14 yrs since [place] died unchanged and tired all the time.  Obesity: a1c nml  Works as a Marine scientist at [employer]. Sees [facility] for gyn and has mammograms done there - has been 3+ yrs since her last.  On OCPs still but not for to much longer per pt per [name].   Past Medical History:  Diagnosis Date  . Anxiety   . Depression   . 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 Hypothyroid dx 1998  . Insomnia 13-Jan-2003 onset   following death of spouse  . Migraines   . Tremor,  essential 03/2011  . Urine, incontinence, stress female    Past Surgical History:  Procedure Laterality Date  . Child birth  58 & 9   x's 2  . TONSILLECTOMY  1970   Current Outpatient Prescriptions on File Prior to Visit  Medication Sig Dispense Refill  . desogestrel-ethinyl estradiol (VIORELE) 0.15-0.02/0.01 MG (21/5) tablet Take 1 tablet by mouth daily. 3 Package 4   No current facility-administered medications on file prior to visit.    Allergies  Allergen Reactions  . Codeine Nausea And Vomiting   Family History  Problem Relation Age of Onset  . Arthritis Mother   . Ovarian cancer Mother 74  . Hyperlipidemia Mother   . Anxiety disorder Mother   . Depression Mother   . Arthritis Father   . Anxiety disorder Father   . Diabetes Father 52       type 2  . Seizures Father   . Cirrhosis Brother        hep c  . Heart disease Maternal Grandmother   . Heart disease Maternal Grandfather    Social History   Social History  . Marital status: Widowed    Spouse name: N/A  . Number of children: N/A  . Years of education: N/A   Social  History Main Topics  . Smoking status: Never Smoker  . Smokeless tobacco: Never Used  . Alcohol use 1.8 - 2.4 oz/week    1 Standard drinks or equivalent, 2 - 3 Glasses of wine per week     Comment: rarely  . Drug use: No  . Sexual activity: No   Other Topics Concern  . None   Social History Narrative   Husband died in 24-Nov-2002 from stomach cancer.   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 Depression screen [facility] 2/9 05/02/2017 08/24/2016  Decreased Interest 1 0  Down, Depressed, Hopeless 1 0  PHQ - 2 Score 2 0  Altered sleeping 3 -  Tired, decreased energy 3 -  Change in appetite 0 -  Feeling bad or failure about yourself  0 -  Trouble concentrating 2 -  Moving slowly or fidgety/restless 0 -  Suicidal thoughts 0 -  PHQ-9 Score 10 -    Review of Systems See hpi    Objective:   Physical Exam    Constitutional: She is oriented to person, place, and time. She appears well-developed and well-nourished. No distress.  HENT:  Head: Normocephalic and atraumatic.  Right Ear: External ear normal.  Left Ear: External ear normal.  Eyes: Conjunctivae are normal. No scleral icterus.  Neck: Normal range of motion. Neck supple. No thyromegaly present.  Cardiovascular: Normal rate, regular rhythm, normal heart sounds and intact distal pulses.   Pulmonary/Chest: Effort normal and breath sounds normal. No respiratory distress.  Musculoskeletal: She exhibits no edema.  Lymphadenopathy:    She has no cervical adenopathy.  Neurological: She is alert and oriented to person, place, and time.  Skin: Skin is warm and dry. She is not diaphoretic. No erythema.  Psychiatric: She has a normal mood and affect. Her behavior is normal.         BP 119/81 (BP Location: Right Arm, Patient Position: Sitting, Cuff Size: Normal)   Pulse 65   Temp 98.3 F (36.8 C) (Oral)   Resp 16   Ht 5\' 4"  (1.626 m)   LMP 04/11/2017   SpO2 95%   Assessment & Plan:  tsh Update mammogram in [facility] Refer for colonoscopy?  Benign head and vocal tremor - fatigue with propranolol. Could try primidone instead and/or add in topiramate (which might help with weight loss as well). Consider neurology eval.  Reactive depression - consider adding in low dose effexor?  1. Hypothyroidism, unspecified type   2. Benign head tremor   3. Anxiety   4. Class 1 obesity due to excess calories without serious comorbidity with body mass index (BMI) of 32.0 to 32.9 in adult     Orders Placed This Encounter  Procedures  . TSH    Meds ordered this encounter  Medications  . SYNTHROID 137 MCG tablet    Sig: TAKE 1 TABLET BY MOUTH DAILY BEFORE BREAKFAST.    Dispense:  30 tablet    Refill:  0  . propranolol (INDERAL) 10 MG tablet    Sig: Take 1 tablet (10 mg total) by mouth 3 (three) times daily.    Dispense:  90 tablet    Refill:  5   . propranolol ER (INDERAL LA) 60 MG 24 hr capsule    Sig: Take 1 capsule (60 mg total) by mouth daily.    Dispense:  30 capsule    Refill:  5  . buPROPion (WELLBUTRIN XL) 300 MG 24 hr tablet  Sig: Take 1 tablet (300 mg total) by mouth daily.    Dispense:  90 tablet    Refill:  1  . topiramate (TOPAMAX) 25 MG tablet    Sig: Start 1 tab po qhs x 1 wk, then 1 tab po bid x 1 wk, increase by 1 tab each week as instructed by physician.    Dispense:  120 tablet    Refill:  0      Delman Cheadle, M.D.  Primary Care at [facility] [address] [place], [place] [address] [phone] phone [phone] fax  05/05/17 10:38 AM

## 2017-05-02 ENCOUNTER — Encounter: Payer: Self-pay | Admitting: Family Medicine

## 2017-05-02 ENCOUNTER — Ambulatory Visit (INDEPENDENT_AMBULATORY_CARE_PROVIDER_SITE_OTHER): Payer: 59 | Admitting: Family Medicine

## 2017-05-02 VITALS — BP 119/81 | HR 65 | Temp 98.3°F | Resp 16 | Ht 64.0 in

## 2017-05-02 DIAGNOSIS — E6609 Other obesity due to excess calories: Secondary | ICD-10-CM | POA: Diagnosis not present

## 2017-05-02 DIAGNOSIS — Z6832 Body mass index (BMI) 32.0-32.9, adult: Secondary | ICD-10-CM | POA: Diagnosis not present

## 2017-05-02 DIAGNOSIS — F419 Anxiety disorder, unspecified: Secondary | ICD-10-CM

## 2017-05-02 DIAGNOSIS — E039 Hypothyroidism, unspecified: Secondary | ICD-10-CM

## 2017-05-02 DIAGNOSIS — G250 Essential tremor: Secondary | ICD-10-CM | POA: Diagnosis not present

## 2017-05-02 MED ORDER — PROPRANOLOL HCL 10 MG PO TABS: 10.0000 mg | ORAL_TABLET | Freq: Three times a day (TID) | ORAL | 5 refills | Status: DC

## 2017-05-02 MED ORDER — PROPRANOLOL HCL ER 60 MG PO CP24: 60.0000 mg | ORAL_CAPSULE | Freq: Every day | ORAL | 5 refills | Status: DC

## 2017-05-02 MED ORDER — BUPROPION HCL ER (XL) 300 MG PO TB24: 300.0000 mg | ORAL_TABLET | Freq: Every day | ORAL | 1 refills | Status: DC

## 2017-05-02 MED ORDER — SYNTHROID 137 MCG PO TABS: ORAL_TABLET | ORAL | 0 refills | Status: DC

## 2017-05-02 MED ORDER — TOPIRAMATE 25 MG PO TABS: ORAL_TABLET | ORAL | 0 refills | Status: DC

## 2017-05-02 MED FILL — SYNTHROID 137 MCG TABLET: 137 | 30 days supply | Qty: 30 | Fill #0

## 2017-05-02 MED FILL — PROPRANOLOL ER 60 MG CAP: 60 | 30 days supply | Qty: 30 | Fill #0

## 2017-05-02 MED FILL — PROPRANOLOL HCL 10 MG TAB: 10 | 30 days supply | Qty: 90 | Fill #0

## 2017-05-02 MED FILL — TOPIRAMATE 25 MG TAB: 25 | 7 days supply | Qty: 120 | Fill #0

## 2017-05-02 NOTE — Patient Instructions (Addendum)
     IF you received an x-ray today, you will receive an invoice from [facility]. Please contact [facility] at [phone] with questions or concerns regarding your invoice.   IF you received labwork today, you will receive an invoice from [facility]. Please contact [facility] at [phone] with questions or concerns regarding your invoice.   Our billing staff will not be able to assist you with questions regarding bills from these companies.  You will be contacted with the lab results as soon as they are available. The fastest way to get your results is to activate your My Chart account. Instructions are located on the last page of this paperwork. If you have not heard from us regarding the results in 2 weeks, please contact this office.    We recommend that you schedule a mammogram for breast cancer screening. Typically, you do not need a referral to do this. Please contact a local imaging center to schedule your mammogram.  [facility] - [phone]  *ask for the Radiology Department [facility]) - [phone] or [phone]  [facility] - [phone] [facility] - [phone] [facility]  - [phone]  *ask for the Radiology Department [facility] - [phone]  *ask for the Radiology Department [facility] - [phone]  *ask for the Mammography Department [facility] - [phone] 

## 2017-05-03 LAB — TSH: TSH: 1.86 u[IU]/mL (ref 0.450–4.500)

## 2017-05-30 ENCOUNTER — Other Ambulatory Visit: Payer: Self-pay | Admitting: Family Medicine

## 2017-05-30 MED FILL — TOPIRAMATE 25 MG TAB: 25 | 30 days supply | Qty: 120 | Fill #0

## 2017-06-06 ENCOUNTER — Other Ambulatory Visit: Payer: Self-pay | Admitting: Family Medicine

## 2017-06-06 MED FILL — BUPROPION HCL XL 300 MG TAB: 300 | 90 days supply | Qty: 90 | Fill #0

## 2017-06-07 ENCOUNTER — Other Ambulatory Visit: Payer: Self-pay | Admitting: Family Medicine

## 2017-06-07 MED FILL — SYNTHROID 137 MCG TABLET: 137 | 90 days supply | Qty: 90 | Fill #0

## 2017-06-07 NOTE — Telephone Encounter (Signed)
90 days sent. She is suppose to come and see you next month for a CPE

## 2017-06-10 MED ORDER — SYNTHROID 137 MCG PO TABS: ORAL_TABLET | ORAL | 0 refills | Status: DC

## 2017-06-25 ENCOUNTER — Encounter: Payer: Self-pay | Admitting: Family Medicine

## 2017-06-26 ENCOUNTER — Other Ambulatory Visit: Payer: Self-pay | Admitting: Family Medicine

## 2017-06-27 MED ORDER — TOPIRAMATE 100 MG PO TABS: 100.0000 mg | ORAL_TABLET | Freq: Two times a day (BID) | ORAL | 0 refills | Status: DC

## 2017-06-27 MED FILL — TOPIRAMATE 100 MG TABLET: 100 | 30 days supply | Qty: 60 | Fill #0

## 2017-07-04 MED FILL — PROPRANOLOL ER 60 MG CAP: 60 | 30 days supply | Qty: 30 | Fill #1

## 2017-07-04 MED FILL — PROPRANOLOL HCL 10 MG TAB: 10 | 30 days supply | Qty: 90 | Fill #1

## 2017-07-24 ENCOUNTER — Other Ambulatory Visit: Payer: Self-pay | Admitting: Family Medicine

## 2017-07-25 ENCOUNTER — Other Ambulatory Visit: Payer: Self-pay | Admitting: Family Medicine

## 2017-07-25 NOTE — Telephone Encounter (Signed)
Dr. Brigitte Pulse,     Can we change patient prescription, quantity and frequency has change?  Per patient email she sent in [facility]

## 2017-07-26 ENCOUNTER — Telehealth: Payer: Self-pay | Admitting: Family Medicine

## 2017-07-26 ENCOUNTER — Other Ambulatory Visit: Payer: Self-pay | Admitting: Family Medicine

## 2017-07-26 DIAGNOSIS — Z1231 Encounter for screening mammogram for malignant neoplasm of breast: Secondary | ICD-10-CM

## 2017-07-26 NOTE — Telephone Encounter (Signed)
Copied from [place] [id number]. Topic: Quick Communication - See Telephone Encounter >> Jul 26, 2017 11:22 AM Robina Ade, Helene Kelp D wrote: CRM for notification. See Telephone encounter for: 07/26/17. Patient needs refill on her synthroid and Topamax sent to her pharmacy. She also wants to know why her Topamax was denied? And would like to know if she needs to come in for lab work to be able to get refill on her medication. Please call patient back, thanks.

## 2017-07-27 NOTE — Telephone Encounter (Signed)
Pt has f/u OV sched w/ me on 12/10 - will eval for additonal refills then.

## 2017-07-27 NOTE — Telephone Encounter (Signed)
I don't know why her topamax was denied - looks like my CMA did this. Sent in topamax refill x 2 mos to [facility]. Will review at next visit and change to longer rx at that time if indicated.   Pt had a 3 mo rx of synthroid sent in on 9/21 (and again 9/24) so should have enough to get her to her visit on 12/10 when we can recheck her tsh before for additional refills. Very important that she stays on her synthroid prior to appt this time.   Delman Cheadle, M.D. Primary Care at [facility] [address] [place], [place] [address] [phone] phone [phone] fax

## 2017-07-29 MED FILL — TOPIRAMATE 100 MG TABLET: 100 | 30 days supply | Qty: 60 | Fill #0

## 2017-08-04 ENCOUNTER — Encounter: Payer: Self-pay | Admitting: Family Medicine

## 2017-08-23 ENCOUNTER — Ambulatory Visit
Admission: RE | Admit: 2017-08-23 | Discharge: 2017-08-23 | Disposition: A | Discharge status: Home / Self Care | Payer: BLUE CROSS/BLUE SHIELD | Source: Ambulatory Visit | Attending: Family Medicine | Admitting: Family Medicine

## 2017-08-23 DIAGNOSIS — Z1231 Encounter for screening mammogram for malignant neoplasm of breast: Secondary | ICD-10-CM

## 2017-08-26 ENCOUNTER — Encounter: Payer: Self-pay | Admitting: Family Medicine

## 2017-08-27 MED FILL — TOPIRAMATE 100 MG TABLET: 100 | 30 days supply | Qty: 60 | Fill #1

## 2017-08-31 ENCOUNTER — Encounter: Payer: Self-pay | Admitting: Family Medicine

## 2017-09-02 MED FILL — BUPROPION HCL XL 300 MG TAB: 300 | 90 days supply | Qty: 90 | Fill #1

## 2017-09-03 ENCOUNTER — Telehealth: Payer: Self-pay | Admitting: Family Medicine

## 2017-09-03 ENCOUNTER — Other Ambulatory Visit: Payer: Self-pay

## 2017-09-03 MED ORDER — SYNTHROID 137 MCG PO TABS: ORAL_TABLET | ORAL | 0 refills | Status: DC

## 2017-09-03 MED FILL — SYNTHROID 137 MCG TABLET: 137 | 30 days supply | Qty: 30 | Fill #0

## 2017-09-03 NOTE — Telephone Encounter (Signed)
Copied from [place] [id number]. Topic: Quick Communication - See Telephone Encounter >> Sep 03, 2017 10:38 AM Burnis Medin, NT wrote: CRM for notification. See Telephone encounter for: Pt called in and said was going to be out of SYNTHROID 137 MCG tablet. Pt. Has an upcoming appointment on 09/30/17 and will be out of medication by then. 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 Pt would like a call back. Pt uses [facility].  09/03/17.

## 2017-09-05 ENCOUNTER — Encounter: Payer: Self-pay | Admitting: Family Medicine

## 2017-09-16 ENCOUNTER — Other Ambulatory Visit: Payer: Self-pay | Admitting: Family Medicine

## 2017-09-16 NOTE — Telephone Encounter (Signed)
Copied from [place] [phone]. Topic: Quick Communication - See Telephone Encounter >> Sep 16, 2017  1:33 PM Vernona Rieger wrote: CRM for notification. See Telephone encounter for:   09/16/17.  Pt states she is out of town and she is having symptoms of a UTI, (burning) she would like something called in at [facility] [address] [place] [phone] Phone is [phone]

## 2017-09-18 NOTE — Telephone Encounter (Signed)
Unable to leave message on pt.'s voicemail to call back and discuss symptoms.

## 2017-09-19 ENCOUNTER — Ambulatory Visit: Payer: Self-pay

## 2017-09-19 ENCOUNTER — Telehealth: Payer: 59 | Admitting: Physician Assistant

## 2017-09-19 DIAGNOSIS — R399 Unspecified symptoms and signs involving the genitourinary system: Secondary | ICD-10-CM | POA: Diagnosis not present

## 2017-09-19 MED ORDER — PHENAZOPYRIDINE HCL 200 MG PO TABS: 200.0000 mg | ORAL_TABLET | Freq: Three times a day (TID) | ORAL | 0 refills | Status: DC | PRN

## 2017-09-19 MED ORDER — CEPHALEXIN 500 MG PO CAPS: 500.0000 mg | ORAL_CAPSULE | Freq: Two times a day (BID) | ORAL | 0 refills | Status: AC

## 2017-09-19 MED FILL — PHENAZOPYRIDINE 200 MG TAB: 200 | 4 days supply | Qty: 10 | Fill #0

## 2017-09-19 NOTE — Telephone Encounter (Signed)
We were disconnected tried calling patient back no answer.    Patient can schedule an appointment for Saturday do not take pyridium on that day.

## 2017-09-19 NOTE — Telephone Encounter (Signed)
Pyridium is avail OTC for her future referrence but will send in rx. Do not take on day of office visit for the UTI as will mess up in office test. She can see ME this Sat - schedule now before slots book up.

## 2017-09-19 NOTE — Telephone Encounter (Signed)
Please advise 

## 2017-09-19 NOTE — Progress Notes (Signed)

## 2017-09-19 NOTE — Telephone Encounter (Signed)
   Reason for Disposition . Urinating more frequently than usual (i.e., frequency)  Answer Assessment - Initial Assessment Questions 1. SYMPTOM: "What's the main symptom you're concerned about?" (e.g., frequency, incontinence)     Burning and urgency 2. ONSET: "When did the  ________  start?"     Started Monday 3. PAIN: "Is there any pain?" If so, ask: "How bad is it?" (Scale: 1-10; mild, moderate, severe)     8-9 4. CAUSE: "What do you think is causing the symptoms?"     UTI 5. OTHER SYMPTOMS: "Do you have any other symptoms?" (e.g., fever, flank pain, blood in urine, pain with urination)     Pain, feels bad all over 6. PREGNANCY: "Is there any chance you are pregnant?" "When was your last menstrual period?"     No  Protocols used: URINARY SYMPTOMS-A-AH  Pt. States she cannot get off work today or tomorrow for a office visit. Request medication be called in until see could be seen at Saturday clinic.

## 2017-09-19 NOTE — Addendum Note (Signed)
Addended by: Brunetta Jeans on: 09/19/2017 08:08 PM   Modules accepted: Orders

## 2017-09-21 ENCOUNTER — Ambulatory Visit: Payer: 59 | Admitting: Family Medicine

## 2017-09-30 ENCOUNTER — Encounter: Payer: Self-pay | Admitting: Family Medicine

## 2017-09-30 ENCOUNTER — Other Ambulatory Visit: Payer: Self-pay

## 2017-09-30 ENCOUNTER — Ambulatory Visit (INDEPENDENT_AMBULATORY_CARE_PROVIDER_SITE_OTHER): Payer: 59 | Admitting: Family Medicine

## 2017-09-30 VITALS — BP 102/68 | HR 84 | Temp 98.1°F | Resp 16 | Ht 64.0 in | Wt 174.0 lb

## 2017-09-30 DIAGNOSIS — Z8041 Family history of malignant neoplasm of ovary: Secondary | ICD-10-CM

## 2017-09-30 DIAGNOSIS — Z1212 Encounter for screening for malignant neoplasm of rectum: Secondary | ICD-10-CM | POA: Diagnosis not present

## 2017-09-30 DIAGNOSIS — F5101 Primary insomnia: Secondary | ICD-10-CM

## 2017-09-30 DIAGNOSIS — Z5181 Encounter for therapeutic drug level monitoring: Secondary | ICD-10-CM | POA: Diagnosis not present

## 2017-09-30 DIAGNOSIS — F329 Major depressive disorder, single episode, unspecified: Secondary | ICD-10-CM | POA: Diagnosis not present

## 2017-09-30 DIAGNOSIS — Z6832 Body mass index (BMI) 32.0-32.9, adult: Secondary | ICD-10-CM

## 2017-09-30 DIAGNOSIS — Z0000 Encounter for general adult medical examination without abnormal findings: Secondary | ICD-10-CM

## 2017-09-30 DIAGNOSIS — Z136 Encounter for screening for cardiovascular disorders: Secondary | ICD-10-CM | POA: Diagnosis not present

## 2017-09-30 DIAGNOSIS — Z1389 Encounter for screening for other disorder: Secondary | ICD-10-CM

## 2017-09-30 DIAGNOSIS — Z1211 Encounter for screening for malignant neoplasm of colon: Secondary | ICD-10-CM

## 2017-09-30 DIAGNOSIS — E6609 Other obesity due to excess calories: Secondary | ICD-10-CM | POA: Diagnosis not present

## 2017-09-30 DIAGNOSIS — Z1383 Encounter for screening for respiratory disorder NEC: Secondary | ICD-10-CM

## 2017-09-30 DIAGNOSIS — F419 Anxiety disorder, unspecified: Secondary | ICD-10-CM | POA: Diagnosis not present

## 2017-09-30 DIAGNOSIS — G250 Essential tremor: Secondary | ICD-10-CM

## 2017-09-30 DIAGNOSIS — E039 Hypothyroidism, unspecified: Secondary | ICD-10-CM

## 2017-09-30 LAB — POCT URINALYSIS DIP (MANUAL ENTRY)
Bilirubin, UA: NEGATIVE
Blood, UA: NEGATIVE
Glucose, UA: NEGATIVE mg/dL
Ketones, POC UA: NEGATIVE mg/dL
Nitrite, UA: NEGATIVE
Protein Ur, POC: NEGATIVE mg/dL
Spec Grav, UA: 1.015 (ref 1.010–1.025)
Urobilinogen, UA: 0.2 E.U./dL
pH, UA: 5.5 (ref 5.0–8.0)

## 2017-09-30 MED ORDER — PRIMIDONE 50 MG PO TABS: ORAL_TABLET | ORAL | 0 refills | Status: DC

## 2017-09-30 MED FILL — PRIMIDONE 50 MG TABLET: 50 | 90 days supply | Qty: 90 | Fill #0

## 2017-09-30 NOTE — Progress Notes (Addendum)
Subjective:  By signing my name below, I, Katherine Garrett, attest that this documentation has been prepared under the direction and in the presence of Delman Cheadle, MD. Electronically Signed: Moises Garrett, [place]. 09/30/2017 , 10:56 AM .  Patient was seen in Room 2 .   Patient ID: Katherine Garrett, female    DOB: 06/02/1966, 52 y.o.   MRN: [id number] Chief Complaint  Patient presents with  . Annual Exam   HPI Katherine Garrett is a 52 y.o. female who presents to Primary Care at [facility] for annual physical exam. She is fasting today.   Primary Preventative Screenings: Cervical Cancer: normal pap with negative HPV in 01-05-15 with Ms. [name], [place]; repeat in 2 more years. GYN has retired. She has occasional hot flashes; been off birth control since Sept 2018; denies vaginal bleeding or discharge.  STI screening: negative HIV and hep C in 01-05-15.  Breast Cancer: normal mammogram Dec 2018 at the breast center.  Colorectal Cancer: Colonoscopy not done yet; referral sent to Dr. Collene Mares.  Tobacco use/EtOH/substances: Bone Density: excellent vitamin D 58 in 05-Jan-2011; plenty of dairy in diet.  Cardiac: EKG 01-05-11, will repeat today.  Weight/Garrett sugar/Diet/Exercise: Walks over 10,000 steps daily. Denies regular exercise outside work due to bone/joint pain.  BMI Readings from Last 3 Encounters:  09/30/17 29.87 kg/m  05/02/17 32.61 kg/m  08/24/16 32.61 kg/m   Wt Readings from Last 3 Encounters:  09/30/17 174 lb (78.9 kg)  02/15/16 190 lb (86.2 kg)  05/12/15 186 lb 8 oz (84.6 kg)   Lab Results  Component Value Date   HGBA1C 5.3 08/24/2016   OTC/Vit/Supp/Herbal: She takes Vitamin D 1000 units.  Immunizations:  Immunization History  Administered Date(s) Administered  . Influenza Whole 05/28/2011  . Influenza,inj,Quad PF,6+ Mos 08/10/2013  . Influenza-Unspecified 06/17/2016, 06/17/2017  . Td 09/17/2002  . Tdap 05/28/2011    Chronic Medical Conditions: Benign head and vocal tremor: She was controlled  on propranolol LA 69 and IR 10 TID, but causing fatigue and exercising limitation so started a trial of topiramate in addition with hopes of weight loss. If topiramate was effective, then wean off propranolol; consider adding in propranolol. Consider adding in primidone, and consider neurology evaluation.   Patient states the topiramate has helped with her head tremor but not her voice. She has noticed worsening bone and joint pain, to the point of crying. She reports increased hip pain when laying on her sides at night. She also has pain with positional changes from sitting to standing, but no pain when walking. She also has to think for words with some feeling of fogginess. She's been taking tylenol and advil for improvement. She denies changing her medication dosing.   Depression: Had been on citalopram for 14 years, so changed to wellbutrin XL 300mg  for weight loss and energy. Her mood has been stable.   Hypothyroidism: Doing well on current synthroid dose.   Past Medical History:  Diagnosis Date  . Anxiety   . Depression   . 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 Hypothyroid dx 1998  . Insomnia 2003/01/05 onset   following death of spouse  . Migraines   . 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 Urine, incontinence, stress female    Past Surgical History:  Procedure Laterality Date  . Child birth  1 & 32   x's 2  . TONSILLECTOMY  1970   Prior to Admission medications   Medication Sig Start Date End Date Taking? Authorizing Provider  buPROPion (WELLBUTRIN XL) 300 MG 24 hr tablet Take 1 tablet (300 mg total) by mouth daily. 05/02/17   Shawnee Knapp, MD  desogestrel-ethinyl estradiol (VIORELE) 0.15-0.02/0.01 MG (21/5) tablet Take 1 tablet by mouth daily. 02/15/16   Kem Boroughs, FNP  phenazopyridine (PYRIDIUM) 200 MG tablet Take 1 tablet (200 mg total) by mouth 3 (three) times daily as needed for pain. 09/19/17   Shawnee Knapp, MD  propranolol (INDERAL) 10 MG tablet Take 1 tablet (10 mg total) by mouth 3 (three)  times daily. 05/02/17   Shawnee Knapp, MD  propranolol ER (INDERAL LA) 60 MG 24 hr capsule Take 1 capsule (60 mg total) by mouth daily. 05/02/17   Shawnee Knapp, MD  SYNTHROID 137 MCG tablet TAKE 1 TABLET BY MOUTH DAILY BEFORE BREAKFAST. 06/07/17   Shawnee Knapp, MD  SYNTHROID 137 MCG tablet TAKE 1 TABLET BY MOUTH DAILY BEFORE BREAKFAST. 09/03/17   Shawnee Knapp, MD  topiramate (TOPAMAX) 100 MG tablet Take 1 tablet (100 mg total) 2 (two) times daily by mouth. **Needs office visit for additional refills - sched 12/10** 07/27/17   Shawnee Knapp, MD   Allergies  Allergen Reactions  . Codeine Nausea And Vomiting   Family History  Problem Relation Age of Onset  . Arthritis Mother   . Ovarian cancer Mother 6  . Hyperlipidemia Mother   . Anxiety disorder Mother   . Depression Mother   . Arthritis Father   . Anxiety disorder Father   . Diabetes Father 64       type 2  . Seizures Father   . Cirrhosis Brother        hep c  . Heart disease Maternal Grandmother   . Heart disease Maternal Grandfather    Social History   Socioeconomic History  . Marital status: Widowed    Spouse name: None  . Number of children: None  . Years of education: None  . Highest education level: None  Social Needs  . Financial resource strain: None  . Food insecurity - worry: None  . Food insecurity - inability: None  . Transportation needs - medical: None  . Transportation needs - non-medical: None  Occupational History  . None  Tobacco Use  . Smoking status: Never Smoker  . Smokeless tobacco: Never Used  Substance and Sexual Activity  . Alcohol use: Yes    Alcohol/week: 1.8 - 2.4 oz    Types: 1 Standard drinks or equivalent, 2 - 3 Glasses of wine per week    Comment: rarely  . Drug use: No  . Sexual activity: No    Partners: Male    Birth control/protection: OCP  Other Topics Concern  . None  Social History Narrative   Husband died in 2002-11-30 from stomach cancer.   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  All other systems reviewed and are negative.      Objective:  Physical Exam  Constitutional: She is oriented to person, place, and time. She appears well-developed and well-nourished. No distress.  HENT:  Head: Normocephalic and atraumatic.  Right Ear: A middle ear effusion is present.  Left Ear: A middle ear effusion is present.  Nose: Nose normal.  Mouth/Throat: Oropharynx is clear and moist.  TM's with mid ear effusion bilaterally with scarring on TM's  Eyes: EOM are normal. Pupils are equal, round, and reactive to light.  Neck: Neck supple. No thyromegaly present.  Cardiovascular: Normal rate, regular rhythm and normal heart sounds.  No murmur heard. Pulmonary/Chest: Effort normal and breath sounds normal. No respiratory distress. Right breast exhibits no mass and no tenderness. Left breast exhibits no mass and no tenderness.  Abdominal: Soft. Bowel sounds are normal.  Musculoskeletal: Normal range of motion.  Lymphadenopathy:    She has no cervical adenopathy.    She has no axillary adenopathy.  Neurological: She is alert and oriented to person, place, and time.  Skin: Skin is warm and dry.  Psychiatric: She has a normal mood and affect. Her behavior is normal.  Nursing note and vitals reviewed.   BP 102/68   Pulse 84   Temp 98.1 F (36.7 C)   Resp 16   Ht 5\' 4"  (1.626 m)   Wt 174 lb (78.9 kg)   LMP 04/11/2017   SpO2 98%   BMI 29.87 kg/m   EKG: sinus bradycardia 56, no acute ischemic changes noted.  Flipped avL with R-wave and decreased voltage throughout, noted when compared to prior EKG done 04/13/2011.   I have personally reviewed the EKG tracing and agree with the computer interpretation. .     Assessment & Plan:   1. Annual physical exam   2. Screening for colorectal cancer   3. Hypothyroidism, unspecified type - tsh suppressed. Decrease 137 to 125 - recheck in 8 wks.  4. Benign head tremor - wean of topamax since causing bone pain - decrease by half every week - see AVS. Start trial of primidone - may need to wean up further  5. Reactive depression   6. Anxiety   7. Class 1 obesity due to excess calories without serious comorbidity with body mass index (BMI) of 32.0 to 32.9 in adult   8. Primary insomnia   9. Medication monitoring encounter   10. Screening for cardiovascular, respiratory, and genitourinary diseases   11. Family history of ovarian cancer     Orders Placed This Encounter  Procedures  . Lipid panel    Order Specific Question:   Has the patient fasted?    Answer:   Yes  . Comprehensive metabolic panel    Order Specific Question:   Has the patient fasted?    Answer:   Yes  . CBC with Differential/Platelet  . TSH+T4F+T3Free  . CA 125  . CA 125  . Ambulatory referral to Gastroenterology    Referral Priority:   Routine    Referral Type:   Consultation    Referral Reason:   Specialty Services Required    Number of Visits Requested:   1  . POCT urinalysis dipstick  . EKG 12-Lead    Meds ordered this encounter  Medications  . primidone (MYSOLINE) 50 MG tablet    Sig: 1/4 tab po qhs x 1 week, then 1/2 tab po qhs x 1 wk, the 1 tab po qhs    Dispense:  90 tablet    Refill:  0    I personally performed  the services described in this documentation, which was scribed in my presence. The recorded information has been reviewed and considered, and addended by me as needed.   Delman Cheadle, M.D.  Primary Care at [facility] [address] [place],  [place] [address] [phone] phone [phone] fax  10/02/17 10:15 PM

## 2017-09-30 NOTE — Patient Instructions (Addendum)
Decrease to 1 tab po qd x 1 week, Then 1/2 tab po qd x 1 week Then 1/4 tab po qd x 1 week  Then stop.  Start the primidone AFTER the bone pain has resolved.   76m qhs is the LOWEST effective dose on the primidone - some people need to take 2582mthree times a day so plenty of room to increase dose if you are tolerating it but need it to be more effective for the tremor. Hopefully, this will continue to control your weight and potentially let you decrease or stop the propranolol so you can get better exercise tolerance.    IF you received an x-ray today, you will receive an invoice from [facility]. Please contact [facility] at [phone] questions or concerns regarding your invoice.   IF you received labwork today, you will receive an invoice from [facility] at [phone] questions or concerns regarding your invoice.   Our billing staff will not be able to assist you with questions regarding bills from these companies.  You will be contacted with the lab results as soon as they are available. The fastest way to get your results is to activate your My Chart account. Instructions are located on the last page of this paperwork. If you have not heard from [facility] results in 2 weeks, please contact this office.      Health Maintenance for Postmenopausal Women Menopause is a normal process in which your reproductive ability comes to an end. This process happens gradually over a span of months to years, usually between the ages of 4818nd 552Menopause is complete when you have missed 12 consecutive menstrual periods. It is important to talk with your health care provider about some of the most common conditions that affect postmenopausal women, such as heart disease, cancer, and bone loss (osteoporosis). Adopting a healthy lifestyle and getting preventive care can help to promote your health and wellness. Those actions can also lower  your chances of developing some of these common conditions. What should I know about menopause? During menopause, you may experience a number of symptoms, such as:  Moderate-to-severe hot flashes.  Night sweats.  Decrease in sex drive.  Mood swings.  Headaches.  Tiredness.  Irritability.  Memory problems.  Insomnia.  Choosing to treat or not to treat menopausal changes is an individual decision that you make with your health care provider. What should I know about hormone replacement therapy and supplements? Hormone therapy products are effective for treating symptoms that are associated with menopause, such as hot flashes and night sweats. Hormone replacement carries certain risks, especially as you become older. If you are thinking about using estrogen or estrogen with progestin treatments, discuss the benefits and risks with your health care provider. What should I know about heart disease and stroke? Heart disease, heart attack, and stroke become more likely as you age. This may be due, in part, to the hormonal changes that your body experiences during menopause. These can affect how your body processes dietary fats, triglycerides, and cholesterol. Heart attack and stroke are both medical emergencies. There are many things that you can do to help prevent heart disease and stroke:  Have your blood pressure checked at least every 1-2 years. High blood pressure causes heart disease and increases the risk of stroke.  If you are 5577942ears old, ask your health care provider if you should take aspirin to prevent a heart attack or a stroke.  Do not use any tobacco products, including cigarettes, chewing tobacco, or electronic cigarettes. If you need help quitting, ask your health care provider.  It is important to eat a healthy diet and maintain a healthy weight. ? Be sure to include plenty of vegetables, fruits, low-fat dairy products, and lean protein. ? Avoid eating foods that  are high in solid fats, added sugars, or salt (sodium).  Get regular exercise. This is one of the most important things that you can do for your health. ? Try to exercise for at least 150 minutes each week. The type of exercise that you do should increase your heart rate and make you sweat. This is known as moderate-intensity exercise. ? Try to do strengthening exercises at least twice each week. Do these in addition to the moderate-intensity exercise.  Know your numbers.Ask your health care provider to check your cholesterol and your blood glucose. Continue to have your blood tested as directed by your health care provider.  What should I know about cancer screening? There are several types of cancer. Take the following steps to reduce your risk and to catch any cancer development as early as possible. Breast Cancer  Practice breast self-awareness. ? This means understanding how your breasts normally appear and feel. ? It also means doing regular breast self-exams. Let your health care provider know about any changes, no matter how small.  If you are 89 or older, have a clinician do a breast exam (clinical breast exam or CBE) every year. Depending on your age, family history, and medical history, it may be recommended that you also have a yearly breast X-ray (mammogram).  If you have a family history of breast cancer, talk with your health care provider about genetic screening.  If you are at high risk for breast cancer, talk with your health care provider about having an MRI and a mammogram every year.  Breast cancer (BRCA) gene test is recommended for women who have family members with BRCA-related cancers. Results of the assessment will determine the need for genetic counseling and BRCA1 and for BRCA2 testing. BRCA-related cancers include these types: ? Breast. This occurs in males or females. ? Ovarian. ? Tubal. This may also be called fallopian tube cancer. ? Cancer of the abdominal  or pelvic lining (peritoneal cancer). ? Prostate. ? Pancreatic.  Cervical, Uterine, and Ovarian Cancer Your health care provider may recommend that you be screened regularly for cancer of the pelvic organs. These include your ovaries, uterus, and vagina. This screening involves a pelvic exam, which includes checking for microscopic changes to the surface of your cervix (Pap test).  For women ages 21-65, health care providers may recommend a pelvic exam and a Pap test every three years. For women ages 4-65, they may recommend the Pap test and pelvic exam, combined with testing for human papilloma virus (HPV), every five years. Some types of HPV increase your risk of cervical cancer. Testing for HPV may also be done on women of any age who have unclear Pap test results.  Other health care providers may not recommend any screening for nonpregnant women who are considered low risk for pelvic cancer and have no symptoms. Ask your health care provider if a screening pelvic exam is right for you.  If you have had past treatment for cervical cancer or a condition that could lead to cancer, you need Pap tests and screening for cancer for at least 20 years after your treatment. If Pap tests have been discontinued  for you, your risk factors (such as having a new sexual partner) need to be reassessed to determine if you should start having screenings again. Some women have medical problems that increase the chance of getting cervical cancer. In these cases, your health care provider may recommend that you have screening and Pap tests more often.  If you have a family history of uterine cancer or ovarian cancer, talk with your health care provider about genetic screening.  If you have vaginal bleeding after reaching menopause, tell your health care provider.  There are currently no reliable tests available to screen for ovarian cancer.  Lung Cancer Lung cancer screening is recommended for adults 22-80 years  old who are at high risk for lung cancer because of a history of smoking. A yearly low-dose CT scan of the lungs is recommended if you:  Currently smoke.  Have a history of at least 30 pack-years of smoking and you currently smoke or have quit within the past 15 years. A pack-year is smoking an average of one pack of cigarettes per day for one year.  Yearly screening should:  Continue until it has been 15 years since you quit.  Stop if you develop a health problem that would prevent you from having lung cancer treatment.  Colorectal Cancer  This type of cancer can be detected and can often be prevented.  Routine colorectal cancer screening usually begins at age 84 and continues through age 17.  If you have risk factors for colon cancer, your health care provider may recommend that you be screened at an earlier age.  If you have a family history of colorectal cancer, talk with your health care provider about genetic screening.  Your health care provider may also recommend using home test kits to check for hidden blood in your stool.  A small camera at the end of a tube can be used to examine your colon directly (sigmoidoscopy or colonoscopy). This is done to check for the earliest forms of colorectal cancer.  Direct examination of the colon should be repeated every 5-10 years until age 87. However, if early forms of precancerous polyps or small growths are found or if you have a family history or genetic risk for colorectal cancer, you may need to be screened more often.  Skin Cancer  Check your skin from head to toe regularly.  Monitor any moles. Be sure to tell your health care provider: ? About any new moles or changes in moles, especially if there is a change in a mole's shape or color. ? If you have a mole that is larger than the size of a pencil eraser.  If any of your family members has a history of skin cancer, especially at a young age, talk with your health care provider  about genetic screening.  Always use sunscreen. Apply sunscreen liberally and repeatedly throughout the day.  Whenever you are outside, protect yourself by wearing long sleeves, pants, a wide-brimmed hat, and sunglasses.  What should I know about osteoporosis? Osteoporosis is a condition in which bone destruction happens more quickly than new bone creation. After menopause, you may be at an increased risk for osteoporosis. To help prevent osteoporosis or the bone fractures that can happen because of osteoporosis, the following is recommended:  If you are 2-32 years old, get at least 1,000 mg of calcium and at least 600 mg of vitamin D per day.  If you are older than age 34 but younger than age 78, get  at least 1,200 mg of calcium and at least 600 mg of vitamin D per day.  If you are older than age 70, get at least 1,200 mg of calcium and at least 800 mg of vitamin D per day.  Smoking and excessive alcohol intake increase the risk of osteoporosis. Eat foods that are rich in calcium and vitamin D, and do weight-bearing exercises several times each week as directed by your health care provider. What should I know about how menopause affects my mental health? Depression may occur at any age, but it is more common as you become older. Common symptoms of depression include:  Low or sad mood.  Changes in sleep patterns.  Changes in appetite or eating patterns.  Feeling an overall lack of motivation or enjoyment of activities that you previously enjoyed.  Frequent crying spells.  Talk with your health care provider if you think that you are experiencing depression. What should I know about immunizations? It is important that you get and maintain your immunizations. These include:  Tetanus, diphtheria, and pertussis (Tdap) booster vaccine.  Influenza every year before the flu season begins.  Pneumonia vaccine.  Shingles vaccine.  Your health care provider may also recommend other  immunizations. This information is not intended to replace advice given to you by your health care provider. Make sure you discuss any questions you have with your health care provider. Document Released: 10/26/2005 Document Revised: 03/23/2016 Document Reviewed: 06/07/2015 Elsevier Interactive Patient Education  2018 Elsevier Inc.  

## 2017-10-01 LAB — COMPREHENSIVE METABOLIC PANEL
ALT: 38 IU/L — ABNORMAL HIGH (ref 0–32)
AST: 26 IU/L (ref 0–40)
Albumin/Globulin Ratio: 1.6 (ref 1.2–2.2)
Albumin: 4.1 g/dL (ref 3.5–5.5)
Alkaline Phosphatase: 76 IU/L (ref 39–117)
BUN/Creatinine Ratio: 17 (ref 9–23)
BUN: 14 mg/dL (ref 6–24)
Bilirubin Total: 0.4 mg/dL (ref 0.0–1.2)
CO2: 22 mmol/L (ref 20–29)
Calcium: 9.3 mg/dL (ref 8.7–10.2)
Chloride: 108 mmol/L — ABNORMAL HIGH (ref 96–106)
Creatinine, Ser: 0.84 mg/dL (ref 0.57–1.00)
GFR calc Af Amer: 92 mL/min/{1.73_m2} (ref >59)
GFR calc non Af Amer: 80 mL/min/{1.73_m2} (ref >59)
Globulin, Total: 2.5 g/dL (ref 1.5–4.5)
Glucose: 85 mg/dL (ref 65–99)
Potassium: 3.9 mmol/L (ref 3.5–5.2)
Sodium: 144 mmol/L (ref 134–144)
Total Protein: 6.6 g/dL (ref 6.0–8.5)

## 2017-10-01 LAB — CBC WITH DIFFERENTIAL/PLATELET
Basophils Absolute: 0 10*3/uL (ref 0.0–0.2)
Basos: 1 %
EOS (ABSOLUTE): 0.1 10*3/uL (ref 0.0–0.4)
Eos: 2 %
Hematocrit: 39.1 % (ref 34.0–46.6)
Hemoglobin: 13 g/dL (ref 11.1–15.9)
Immature Grans (Abs): 0 10*3/uL (ref 0.0–0.1)
Immature Granulocytes: 0 %
Lymphocytes Absolute: 1.6 10*3/uL (ref 0.7–3.1)
Lymphs: 38 %
MCH: 29.5 pg (ref 26.6–33.0)
MCHC: 33.2 g/dL (ref 31.5–35.7)
MCV: 89 fL (ref 79–97)
Monocytes Absolute: 0.4 10*3/uL (ref 0.1–0.9)
Monocytes: 9 %
Neutrophils Absolute: 2.2 10*3/uL (ref 1.4–7.0)
Neutrophils: 50 %
Platelets: 240 10*3/uL (ref 150–379)
RBC: 4.41 x10E6/uL (ref 3.77–5.28)
RDW: 14.2 % (ref 12.3–15.4)
WBC: 4.3 10*3/uL (ref 3.4–10.8)

## 2017-10-01 LAB — CA 125: Cancer Antigen (CA) 125: 16.1 U/mL (ref 0.0–38.1)

## 2017-10-01 LAB — LIPID PANEL
Chol/HDL Ratio: 2.7 ratio (ref 0.0–4.4)
Cholesterol, Total: 192 mg/dL (ref 100–199)
HDL: 70 mg/dL (ref >39)
LDL Calculated: 108 mg/dL — ABNORMAL HIGH (ref 0–99)
Triglycerides: 68 mg/dL (ref 0–149)
VLDL Cholesterol Cal: 14 mg/dL (ref 5–40)

## 2017-10-01 LAB — TSH+T4F+T3FREE
Free T4: 1.46 ng/dL (ref 0.82–1.77)
T3, Free: 2.9 pg/mL (ref 2.0–4.4)
TSH: 0.292 u[IU]/mL — ABNORMAL LOW (ref 0.450–4.500)

## 2017-10-07 ENCOUNTER — Encounter: Payer: Self-pay | Admitting: Family Medicine

## 2017-10-11 ENCOUNTER — Telehealth: Payer: Self-pay | Admitting: Family Medicine

## 2017-10-11 NOTE — Telephone Encounter (Signed)
Copied from [place] #[id number]. Topic: Quick Communication - See Telephone Encounter >> Oct 11, 2017  4:00 PM Clack, Laban Emperor wrote: CRM for notification. See Telephone encounter for: Pt called for her lab results, states she did recv'd them on her MyChart but still would like someone to call her to go over them. Pt also states that she only has 1 pill left of her  SYNTHROID 137 MCG tablet [[id number]].  [place], [place] - [place] [phone] (Phone) [phone] (Fax)    10/11/17.

## 2017-10-11 NOTE — Telephone Encounter (Signed)
Copied from [place] #[id number]. Topic: Quick Communication - See Telephone Encounter >> Oct 11, 2017  4:00 PM Clack, Laban Emperor wrote: CRM for notification. See Telephone encounter for: Pt called for her lab results, states she did recv'd them on her MyChart but still would like someone to call her to go over them. Pt also states that she only has 1 pill left of her  SYNTHROID 137 MCG tablet [[id number]].  [place], [place] - [place] [phone] (Phone) [phone] (Fax)    10/11/17.

## 2017-10-12 NOTE — Telephone Encounter (Signed)
Provider, please provide detailed lab results to patient and advise regarding medication.

## 2017-10-13 MED ORDER — SYNTHROID 125 MCG PO TABS: 125.0000 ug | ORAL_TABLET | Freq: Every day | ORAL | 0 refills | Status: DC

## 2017-10-13 NOTE — Telephone Encounter (Signed)
Sent message to  My Chart .

## 2017-10-13 NOTE — Addendum Note (Signed)
Addended by: Shawnee Knapp on: 10/13/2017 01:13 PM   Modules accepted: Orders

## 2017-10-13 NOTE — Addendum Note (Signed)
Addended by: Shawnee Knapp on: 10/13/2017 10:50 AM   Modules accepted: Orders

## 2017-10-14 MED FILL — PROPRANOLOL ER 60 MG CAP: 60 | 30 days supply | Qty: 30 | Fill #2

## 2017-10-14 MED FILL — PROPRANOLOL HCL 10 MG TAB: 10 | 30 days supply | Qty: 90 | Fill #2

## 2017-10-14 MED FILL — SYNTHROID 125 MCG TABLET: 125 | 90 days supply | Qty: 90 | Fill #0

## 2017-11-25 ENCOUNTER — Other Ambulatory Visit: Payer: Self-pay | Admitting: Family Medicine

## 2017-11-25 MED FILL — BUPROPION HCL XL 300 MG TAB: 300 | 90 days supply | Qty: 90 | Fill #0

## 2017-12-17 MED FILL — PROPRANOLOL 10 MG TABLET: 10 | 30 days supply | Qty: 90 | Fill #3

## 2018-01-10 ENCOUNTER — Encounter (HOSPITAL_COMMUNITY): Payer: Self-pay | Admitting: Family Medicine

## 2018-01-10 ENCOUNTER — Ambulatory Visit (HOSPITAL_COMMUNITY)
Admission: EM | Admit: 2018-01-10 | Discharge: 2018-01-10 | Disposition: A | Discharge status: Home / Self Care | Payer: 59 | Attending: Family Medicine | Admitting: Family Medicine

## 2018-01-10 ENCOUNTER — Telehealth: Payer: 59 | Admitting: Family

## 2018-01-10 DIAGNOSIS — B353 Tinea pedis: Secondary | ICD-10-CM

## 2018-01-10 DIAGNOSIS — L03032 Cellulitis of left toe: Secondary | ICD-10-CM

## 2018-01-10 DIAGNOSIS — L02612 Cutaneous abscess of left foot: Secondary | ICD-10-CM | POA: Diagnosis not present

## 2018-01-10 MED ORDER — DOXYCYCLINE HYCLATE 100 MG PO TABS: 100.0000 mg | ORAL_TABLET | Freq: Two times a day (BID) | ORAL | 0 refills | Status: DC

## 2018-01-10 MED ORDER — KETOCONAZOLE 2 % EX CREA: 1.0000 "application " | TOPICAL_CREAM | Freq: Two times a day (BID) | CUTANEOUS | 1 refills | Status: AC

## 2018-01-10 MED FILL — KETOCONAZOLE 2% CREAM: 2 | 30 days supply | Qty: 30 | Fill #0

## 2018-01-10 MED FILL — DOXYCYCLINE HYCLATE 100 MG: 100 | 10 days supply | Qty: 20 | Fill #0

## 2018-01-10 NOTE — ED Provider Notes (Signed)
[place]   [id number] 01/10/18 Arrival Time: 3086   SUBJECTIVE:  Katherine Garrett is a 52 y.o. female who presents to the urgent care with complaint of pain and rash to the left foot and in between toes. sts that she was treating athletes foot and it has worsened. She is having more redness and swelling and low grade fever. She took tylenol this am.  Works as a Marine scientist. Past Medical History:  Diagnosis Date  . Anxiety   . Depression   . 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 Hypothyroid dx 1998  . Insomnia December 23, 2002 onset   following death of spouse  . Migraines   . 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 Urine, incontinence, stress female    Family History  Problem Relation Age of Onset  . Arthritis Mother   . Ovarian cancer Mother 100  . Hyperlipidemia Mother   . Anxiety disorder Mother   . Depression Mother   . Arthritis Father   . Anxiety disorder Father   . Diabetes Father 42       type 2  . Seizures Father   . Cirrhosis Brother        hep c  . Heart disease Maternal Grandmother   . Heart disease Maternal Grandfather    Social History   Socioeconomic History  . Marital status: Widowed    Spouse name: Not on file  . Number of children: Not on file  . Years of education: Not on file  . Highest education level: Not on file  Occupational History  . Not on file  Social Needs  . Financial resource strain: Not on file  . Food insecurity:    Worry: Not on file    Inability: Not on file  . Transportation needs:    Medical: Not on file    Non-medical: Not on file  Tobacco Use  . Smoking status: Never Smoker  . Smokeless tobacco: Never Used  Substance and Sexual Activity  . Alcohol use: Yes    Alcohol/week: 1.8 - 2.4 oz    Types: 1 Standard drinks or equivalent, 2 - 3 Glasses of wine per week    Comment: rarely  . Drug use: No  . Sexual activity: Never    Partners: Male    Birth control/protection: OCP  Lifestyle  . Physical activity:    Days per week: Not on file    Minutes per session: Not on file  . Stress: Not on file  Relationships  . Social connections:    Talks on phone: Not on file    Gets together: Not on file    Attends religious service: Not on file    Active member of club or organization: Not on file    Attends meetings of clubs or organizations: Not on file    Relationship status: Not on file  . Intimate partner violence:    Fear of current or ex partner: Not on file    Emotionally abused: Not on file    Physically abused: Not on file    Forced sexual activity: Not on file  Other Topics Concern  . Not on file  Social History Narrative   Husband died in 2002/11/24 from stomach cancer.   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   Allergies  Allergen Reactions  . Codeine Nausea And Vomiting  ROS: As per HPI, remainder of ROS negative.   OBJECTIVE:   Vitals:   01/10/18 1340  BP: (!) 144/88  Pulse: 93  Resp: 18  Temp: 98.3 F (36.8 C)  SpO2: 97%     General appearance: alert; no distress Eyes: PERRL; EOMI; conjunctiva normal HENT: normocephalic; atraumatic;oral mucosa normal Neck: supple  Back: no CVA tenderness Extremities: no cyanosis or edema; symmetrical with no gross deformities Skin: warm and dry  With erythema extending from web space of great toe and scattered small pustules    Neurologic: normal gait; grossly normal Psychological: alert and cooperative; normal mood and affect      Labs:  Results for orders placed or performed in visit on 09/30/17  Lipid panel  Result Value Ref Range   Cholesterol, Total 192 100 - 199 mg/dL   Triglycerides 68 0 - 149 mg/dL   HDL 70 >39 mg/dL   VLDL Cholesterol Cal 14 5 - 40 mg/dL   LDL Calculated 108 (H) 0 - 99 mg/dL   Chol/HDL Ratio 2.7 0.0 - 4.4 ratio  Comprehensive metabolic panel  Result Value Ref Range   Glucose 85 65  - 99 mg/dL   BUN 14 6 - 24 mg/dL   Creatinine, Ser 0.84 0.57 - 1.00 mg/dL   GFR calc non Af Amer 80 >59 mL/min/1.73   GFR calc Af Amer 92 >59 mL/min/1.73   BUN/Creatinine Ratio 17 9 - 23   Sodium 144 134 - 144 mmol/L   Potassium 3.9 3.5 - 5.2 mmol/L   Chloride 108 (H) 96 - 106 mmol/L   CO2 22 20 - 29 mmol/L   Calcium 9.3 8.7 - 10.2 mg/dL   Total Protein 6.6 6.0 - 8.5 g/dL   Albumin 4.1 3.5 - 5.5 g/dL   Globulin, Total 2.5 1.5 - 4.5 g/dL   Albumin/Globulin Ratio 1.6 1.2 - 2.2   Bilirubin Total 0.4 0.0 - 1.2 mg/dL   Alkaline Phosphatase 76 39 - 117 IU/L   AST 26 0 - 40 IU/L   ALT 38 (H) 0 - 32 IU/L  CBC with Differential/Platelet  Result Value Ref Range   WBC 4.3 3.4 - 10.8 x10E3/uL   RBC 4.41 3.77 - 5.28 x10E6/uL   Hemoglobin 13.0 11.1 - 15.9 g/dL   Hematocrit 39.1 34.0 - 46.6 %   MCV 89 79 - 97 fL   MCH 29.5 26.6 - 33.0 pg   MCHC 33.2 31.5 - 35.7 g/dL   RDW 14.2 12.3 - 15.4 %   Platelets 240 150 - 379 x10E3/uL   Neutrophils 50 Not Estab. %   Lymphs 38 Not Estab. %   Monocytes 9 Not Estab. %   Eos 2 Not Estab. %   Basos 1 Not Estab. %   Neutrophils Absolute 2.2 1.4 - 7.0 x10E3/uL   Lymphocytes Absolute 1.6 0.7 - 3.1 x10E3/uL   Monocytes Absolute 0.4 0.1 - 0.9 x10E3/uL   EOS (ABSOLUTE) 0.1 0.0 - 0.4 x10E3/uL   Basophils Absolute 0.0 0.0 - 0.2 x10E3/uL   Immature Granulocytes 0 Not Estab. %   Immature Grans (Abs) 0.0 0.0 - 0.1 x10E3/uL  TSH+T4F+T3Free  Result Value Ref Range   TSH 0.292 (L) 0.450 - 4.500 uIU/mL   T3, Free 2.9 2.0 - 4.4 pg/mL   Free T4 1.46 0.82 - 1.77 ng/dL  CA 125  Result Value Ref Range   Cancer Antigen (CA) 125 16.1 0.0 - 38.1 U/mL  POCT urinalysis dipstick  Result Value Ref Range   Color,  UA yellow yellow   Clarity, UA clear clear   Glucose, UA negative negative mg/dL   Bilirubin, UA negative negative   Ketones, POC UA negative negative mg/dL   Spec Grav, UA 1.015 1.010 - 1.025   Blood, UA negative negative   pH, UA 5.5 5.0 - 8.0   Protein  Ur, POC negative negative mg/dL   Urobilinogen, UA 0.2 0.2 or 1.0 E.U./dL   Nitrite, UA Negative Negative   Leukocytes, UA Trace (A) Negative    Labs Reviewed - No data to display  No results found.     ASSESSMENT & PLAN:  1. Cellulitis and abscess of toe of left foot     Meds ordered this encounter  Medications  . ketoconazole (NIZORAL) 2 % cream    Sig: Apply 1 application topically 2 (two) times daily.    Dispense:  30 g    Refill:  1  . doxycycline (VIBRA-TABS) 100 MG tablet    Sig: Take 1 tablet (100 mg total) by mouth 2 (two) times daily.    Dispense:  20 tablet    Refill:  0    Reviewed expectations re: course of current medical issues. Questions answered. Outlined signs and symptoms indicating need for more acute intervention. Patient verbalized understanding. After Visit Summary given.    Procedures:      Robyn Haber, MD 01/10/18 1350

## 2018-01-10 NOTE — Progress Notes (Signed)
Based on what you shared with me it looks like you have a serious condition that should be evaluated in a face to face office visit.  NOTE: If you entered your credit card information for this eVisit, you will not be charged. You may see a "hold" on your card for the $30 but that hold will drop off and you will not have a charge processed.  If you are having a true medical emergency please call 911.  If you need an urgent face to face visit, [facility] has four urgent care centers for your convenience.  If you need care fast and have a high deductible or no insurance consider:   [email or web address] to reserve your spot online an avoid wait times  [facility] [address], [address] [place], [place] [address] 8 am to 8 pm Monday-Friday 10 am to 4 pm Saturday-Sunday *Across the street from [employer]  [facility]  [address] [place], [address] 8 am to 5 pm Monday-Friday * In the [facility]   The following sites will take your  insurance:  . [facility]  [phone] Get Driving Directions Find a Provider at this Location  [address] [place], [place] [address] . 10 am to 8 pm Monday-Friday . 12 pm to 8 pm Saturday-Sunday   . [facility]  [phone] Get Driving Directions Find a Provider at this Location  [address], [address] [place], [place] [address] . 8 am to 8 pm Monday-Friday . 9 am to 6 pm Saturday . 11 am to 6 pm Sunday   . [facility] [place]  [phone] Get Driving Directions  [address] [place],  [address] . 8 am to 8 pm Monday-Friday . 8 am to 4 pm Saturday-Sunday   Your e-visit answers were reviewed by a board certified advanced clinical practitioner to complete your personal care plan.  Thank you for using e-Visits.  

## 2018-01-10 NOTE — ED Triage Notes (Signed)
Pt here for pain and rash to the left foot and in between toes. sts that she was treating athletes foot and it has worsened. She is having more redness and swelling and low grade fever. She took tylenol this am.

## 2018-01-14 ENCOUNTER — Ambulatory Visit (INDEPENDENT_AMBULATORY_CARE_PROVIDER_SITE_OTHER): Payer: 59 | Admitting: Family Medicine

## 2018-01-14 ENCOUNTER — Encounter: Payer: Self-pay | Admitting: Family Medicine

## 2018-01-14 DIAGNOSIS — E039 Hypothyroidism, unspecified: Secondary | ICD-10-CM

## 2018-01-14 NOTE — Progress Notes (Signed)
Lab only visit. Pt did not see provider

## 2018-01-15 ENCOUNTER — Other Ambulatory Visit: Payer: Self-pay | Admitting: Family Medicine

## 2018-01-15 LAB — TSH: TSH: 2.79 u[IU]/mL (ref 0.450–4.500)

## 2018-01-15 MED ORDER — PRIMIDONE 50 MG PO TABS: 100.0000 mg | ORAL_TABLET | Freq: Every day | ORAL | 1 refills | Status: DC

## 2018-01-15 MED ORDER — SYNTHROID 125 MCG PO TABS: 125.0000 ug | ORAL_TABLET | Freq: Every day | ORAL | 3 refills | Status: DC

## 2018-01-15 NOTE — Addendum Note (Signed)
Addended by: Shawnee Knapp on: 01/15/2018 07:51 AM   Modules accepted: Orders

## 2018-01-17 ENCOUNTER — Other Ambulatory Visit: Payer: Self-pay | Admitting: Family Medicine

## 2018-01-20 MED ORDER — BUPROPION HCL ER (XL) 300 MG PO TB24: 300.0000 mg | ORAL_TABLET | Freq: Every day | ORAL | 0 refills | Status: DC

## 2018-01-24 MED FILL — PROPRANOLOL ER 60 MG CAP: 60 | 30 days supply | Qty: 30 | Fill #3

## 2018-01-24 MED FILL — PRIMIDONE 50 MG TABLET: 50 | 90 days supply | Qty: 180 | Fill #0

## 2018-01-24 MED FILL — SYNTHROID 125 MCG TABLET: 125 | 90 days supply | Qty: 90 | Fill #0

## 2018-02-06 MED FILL — PROPRANOLOL 10 MG TABLET: 10 | 30 days supply | Qty: 90 | Fill #4

## 2018-02-10 ENCOUNTER — Encounter: Payer: Self-pay | Admitting: Family Medicine

## 2018-02-27 ENCOUNTER — Other Ambulatory Visit: Payer: Self-pay | Admitting: Family Medicine

## 2018-02-27 ENCOUNTER — Encounter: Payer: Self-pay | Admitting: Family Medicine

## 2018-02-27 MED FILL — buPROPion HCL ER (XL) 300 M: 300 | 90 days supply | Qty: 90 | Fill #0

## 2018-02-27 NOTE — Telephone Encounter (Signed)
Left VM for clarification; refilled 01/20/18    #90. Too early for refill

## 2018-03-13 ENCOUNTER — Ambulatory Visit (INDEPENDENT_AMBULATORY_CARE_PROVIDER_SITE_OTHER): Payer: 59

## 2018-03-13 ENCOUNTER — Encounter (HOSPITAL_COMMUNITY): Payer: Self-pay | Admitting: Emergency Medicine

## 2018-03-13 ENCOUNTER — Ambulatory Visit (HOSPITAL_COMMUNITY)
Admission: EM | Admit: 2018-03-13 | Discharge: 2018-03-13 | Disposition: A | Discharge status: Home / Self Care | Payer: 59 | Attending: Family Medicine | Admitting: Family Medicine

## 2018-03-13 DIAGNOSIS — J189 Pneumonia, unspecified organism: Secondary | ICD-10-CM

## 2018-03-13 DIAGNOSIS — R05 Cough: Secondary | ICD-10-CM | POA: Diagnosis not present

## 2018-03-13 DIAGNOSIS — J181 Lobar pneumonia, unspecified organism: Secondary | ICD-10-CM | POA: Diagnosis not present

## 2018-03-13 MED ORDER — AZITHROMYCIN 250 MG PO TABS: 250.0000 mg | ORAL_TABLET | Freq: Every day | ORAL | 0 refills | Status: DC

## 2018-03-13 MED ORDER — ALBUTEROL SULFATE HFA 108 (90 BASE) MCG/ACT IN AERS
2.0000 | INHALATION_SPRAY | Freq: Once | RESPIRATORY_TRACT | Status: AC
  Administered 2018-03-13: 2 via RESPIRATORY_TRACT

## 2018-03-13 MED ORDER — ALBUTEROL SULFATE HFA 108 (90 BASE) MCG/ACT IN AERS
INHALATION_SPRAY | RESPIRATORY_TRACT | Status: AC
  Filled 2018-03-13: qty 6.7

## 2018-03-13 MED FILL — AZITHROMYCIN 250 MG TABLET: 250 | 5 days supply | Qty: 6 | Fill #0

## 2018-03-13 NOTE — ED Provider Notes (Signed)
Katherine Garrett   [id number] 03/13/18 Arrival Time: Jan 03, 1054  SUBJECTIVE:  Katherine Garrett is a 52 y.o. female who presents with cough and fever that began 3 days ago.  Denies positive sick exposure or precipitating event, but does admit to inhaling "ocean water" 3 weeks ago while at the beach.  Describes cough as intermittent and dry.  Has tried Advil and tylenol with minimal relief.  Denies worsening symptoms.  Denies previous symptoms in the past.   Complains of fever with Tmax 101.6 at home, chills, fatigue, and sinus pressure.  Denies rhinorrhea, sore throat, SOB, wheezing, chest pain, nausea, changes in bowel or bladder habits.    ROS: As per HPI.  Past Medical History:  Diagnosis Date  . Anxiety   . Depression   . 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 Hypothyroid dx 1998  . Insomnia 2003/01/04 onset   following death of spouse  . Migraines   . 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 Urine, incontinence, stress female    Past Surgical History:  Procedure Laterality Date  . Child birth  13 & 1   x's 2  . TONSILLECTOMY  1970   Allergies  Allergen Reactions  . Codeine Nausea And Vomiting   No current facility-administered medications on file prior to encounter.    Current Outpatient Medications on File Prior to Encounter  Medication Sig Dispense Refill  . buPROPion (WELLBUTRIN XL) 300 MG 24 hr tablet Take 1 tablet (300 mg total) by mouth daily. Office visit needed 90 tablet 0  . doxycycline (VIBRA-TABS) 100 MG tablet Take 1 tablet (100 mg total) by mouth 2 (two) times daily. 20 tablet 0  . ketoconazole (NIZORAL) 2 % cream Apply 1 application topically 2 (two) times daily. 30 g 1  . primidone (MYSOLINE) 50 MG tablet Take 2 tablets (100 mg total) by mouth at bedtime. 180 tablet 1  . propranolol (INDERAL) 10 MG tablet Take 1 tablet (10 mg total) by mouth 3 (three) times daily. 90 tablet 5  . propranolol ER (INDERAL LA) 60 MG 24 hr capsule Take 1 capsule (60 mg total) by mouth daily. 30 capsule 5   . SYNTHROID 125 MCG tablet Take 1 tablet (125 mcg total) by mouth daily before breakfast. 90 tablet 3    Social History   Socioeconomic History  . Marital status: Widowed    Spouse name: Not on file  . Number of children: Not on file  . Years of education: Not on file  . Highest education level: Not on file  Occupational History  . Not on file  Social Needs  . Financial resource strain: Not on file  . Food insecurity:    Worry: Not on file    Inability: Not on file  . Transportation needs:    Medical: Not on file    Non-medical: Not on file  Tobacco Use  . Smoking status: Never Smoker  . Smokeless tobacco: Never Used  Substance and Sexual Activity  . Alcohol use: Yes    Alcohol/week: 1.8 - 2.4 oz    Types: 1 Standard drinks or equivalent, 2 - 3 Glasses of wine per week    Comment: rarely  . Drug use: No  . Sexual activity: Never    Partners: Male    Birth control/protection: OCP  Lifestyle  . Physical activity:    Days per week: Not on file    Minutes per session: Not on file  . Stress: Not on file  Relationships  .  Social connections:    Talks on phone: Not on file    Gets together: Not on file    Attends religious service: Not on file    Active member of club or organization: Not on file    Attends meetings of clubs or organizations: Not on file    Relationship status: Not on file  . Intimate partner violence:    Fear of current or ex partner: Not on file    Emotionally abused: Not on file    Physically abused: Not on file    Forced sexual activity: Not on file  Other Topics Concern  . Not on file  Social History Narrative   Husband died in 2002/12/12 from stomach cancer.   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 Arthritis Mother   . Ovarian cancer Mother 41  . Hyperlipidemia Mother   . Anxiety disorder Mother   . Depression Mother   . Arthritis Father   . Anxiety disorder  Father   . Diabetes Father 31       type 2  . Seizures Father   . Cirrhosis Brother        hep c  . Heart disease Maternal Grandmother   . Heart disease Maternal Grandfather      OBJECTIVE:  Vitals:   03/13/18 1108  BP: 137/85  Pulse: (!) 128  Resp: 18  Temp: 100.3 F (37.9 C)  TempSrc: Oral  SpO2: 94%     General appearance: AOx3 in no acute distress; appears fatigued HEENT: PERRL.  EOM grossly intact.  Sinuses nontender; mild clear rhinorrhea; tonsils nonerythematous, uvula midline Neck: supple without LAD Lungs: clear to auscultation bilaterally without adventitious breath sounds Heart: Tachycardic, but regular.  Radial pulses 2+ symmetrical bilaterally; cap refill <2 secs Skin: warm and dry; no tenting Psychological: alert and cooperative; normal mood and affect  DIAGNOSTIC STUDIES:   CLINICAL DATA: Cough, fever, chills.  EXAM: CHEST - 2 VIEW  COMPARISON: No recent prior.  FINDINGS: Mediastinum hilar structures normal. Right middle lobe dense consolidation most consistent pneumonia. Close follow-up exams 6 demonstrate clearing to exclude underlying mass lesion suggested. No pleural effusion or pneumothorax. No acute bony abnormality. Degenerative change thoracic spine.  IMPRESSION: Dense right middle lobe infiltrate consistent with pneumonia. Followup PA and lateral chest X-ray is recommended in 3-4 weeks following trial of antibiotic therapy to ensure resolution and exclude underlying malignancy.   Electronically Signed By: Marcello Moores Register On: 03/13/2018 11:57  I have reviewed the x-rays myself and the radiologist interpretation. I am in agreement with the radiologist interpretation.     ASSESSMENT & PLAN:  1. Pneumonia of right middle lobe due to infectious organism [facility])     Meds ordered this encounter  Medications  . albuterol (PROVENTIL HFA;VENTOLIN HFA) 108 (90 Base) MCG/ACT inhaler 2 puff  . azithromycin (ZITHROMAX) 250 MG tablet     Sig: Take 1 tablet (250 mg total) by mouth daily. Take first 2 tablets together, then 1 every day until finished.    Dispense:  6 tablet    Refill:  0    Order Specific Question:   Supervising Provider    Answer:   Wynona Luna [[id number]]   X-rays showed a pneumonia Inhaler given in office Get plenty of rest and push fluids Use OTC medications as needed for symptomatic relief of fever and body aches Azithromycin prescribed.  Take as  prescribed and to completion.   Take antibiotic as directed and to completion Follow up with PCP if symptoms persist Return or go to ER if you have any new or worsening symptoms   Recommend repeat x-ray in 6 weeks to exclude underlying malignancy  Reviewed expectations re: course of current medical issues. Questions answered. Outlined signs and symptoms indicating need for more acute intervention. Patient verbalized understanding. After Visit Summary given.          Lestine Box, PA-C 03/13/18 1206

## 2018-03-13 NOTE — ED Triage Notes (Signed)
Pt with fever and chills x 4 days

## 2018-03-13 NOTE — Discharge Instructions (Addendum)
X-rays showed a pneumonia Inhaler given in office Get plenty of rest and push fluids Use OTC medications as needed for symptomatic relief of fever and body aches Azithromycin prescribed.  Take as prescribed and to completion.   Take antibiotic as directed and to completion Follow up with PCP if symptoms persist Return or go to ER if you have any new or worsening symptoms

## 2018-04-04 MED FILL — PROPRANOLOL ER 60 MG CAP: 60 | 30 days supply | Qty: 30 | Fill #4

## 2018-04-04 MED FILL — KETOCONAZOLE 2% CREAM: 2 | 30 days supply | Qty: 30 | Fill #1

## 2018-04-11 ENCOUNTER — Ambulatory Visit (HOSPITAL_COMMUNITY)
Admission: EM | Admit: 2018-04-11 | Discharge: 2018-04-11 | Disposition: A | Discharge status: Home / Self Care | Payer: 59 | Attending: Internal Medicine | Admitting: Internal Medicine

## 2018-04-11 ENCOUNTER — Ambulatory Visit (INDEPENDENT_AMBULATORY_CARE_PROVIDER_SITE_OTHER): Payer: 59

## 2018-04-11 ENCOUNTER — Other Ambulatory Visit: Payer: Self-pay

## 2018-04-11 ENCOUNTER — Encounter (HOSPITAL_COMMUNITY): Payer: Self-pay | Admitting: Emergency Medicine

## 2018-04-11 DIAGNOSIS — J189 Pneumonia, unspecified organism: Secondary | ICD-10-CM | POA: Diagnosis not present

## 2018-04-11 DIAGNOSIS — J181 Lobar pneumonia, unspecified organism: Secondary | ICD-10-CM

## 2018-04-11 DIAGNOSIS — Z09 Encounter for follow-up examination after completed treatment for conditions other than malignant neoplasm: Secondary | ICD-10-CM | POA: Diagnosis not present

## 2018-04-11 DIAGNOSIS — R05 Cough: Secondary | ICD-10-CM | POA: Diagnosis not present

## 2018-04-11 NOTE — ED Notes (Signed)
Bed: UC01 Expected date:  Expected time:  Means of arrival:  Comments: Appointments 

## 2018-04-11 NOTE — Discharge Instructions (Signed)
Pneumonia appears to have resolved Follow up if symptoms returning

## 2018-04-11 NOTE — ED Triage Notes (Signed)
Patient states she is here for a follow-up chest xray.

## 2018-04-11 NOTE — ED Provider Notes (Signed)
[facility]    CSN: [id number] Arrival date & time: 04/11/18  1456     History   Chief Complaint Chief Complaint  Patient presents with  . Follow-up    HPI Katherine Garrett is a 52 y.o. female history of hypothyroid, anxiety presenting today for follow-up from pneumonia.  Patient was seen here approximately 1 month ago and found to have right middle lobe pneumonia.  She took her meds azithromycin.  Since she states her symptoms are relatively resolved, she has an occasional lingering cough, but no persistent cough.  Overall feeling back to normal.  Denies any persistent fevers.  Denies any shortness of breath or chest pain.  HPI  Past Medical History:  Diagnosis Date  . Anxiety   . Depression   . 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 Hypothyroid dx 1998  . Insomnia January 10, 2003 onset   following death of spouse  . Migraines   . 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 Urine, incontinence, stress female     Patient Active Problem List   Diagnosis Date Noted  . Insomnia 08/24/2016  . Obese 05/12/2015  . Hypothyroid 04/15/2011  . Benign head tremor 04/15/2011  . Depression 04/15/2011  . Anxiety 04/15/2011    Past Surgical History:  Procedure Laterality Date  . Child birth  40 & 106   x's 2  . TONSILLECTOMY  1970    OB History    Gravida  3   Para  2   Term  2   Preterm  0   AB  1   Living  2     SAB  0   TAB  0   Ectopic  0   Multiple  0   Live Births  2            Home Medications    Prior to Admission medications   Medication Sig Start Date End Date Taking? Authorizing Provider  azithromycin (ZITHROMAX) 250 MG tablet Take 1 tablet (250 mg total) by mouth daily. Take first 2 tablets together, then 1 every day until finished. 03/13/18   Wurst, [place], PA-C  buPROPion (WELLBUTRIN XL) 300 MG 24 hr tablet Take 1 tablet (300 mg total) by mouth daily. Office visit needed 01/20/18   Shawnee Knapp, MD  doxycycline (VIBRA-TABS) 100 MG tablet Take 1 tablet (100 mg  total) by mouth 2 (two) times daily. 01/10/18   Robyn Haber, MD  ketoconazole (NIZORAL) 2 % cream Apply 1 application topically 2 (two) times daily. 01/10/18   Robyn Haber, MD  primidone (MYSOLINE) 50 MG tablet Take 2 tablets (100 mg total) by mouth at bedtime. 01/15/18   Shawnee Knapp, MD  propranolol (INDERAL) 10 MG tablet Take 1 tablet (10 mg total) by mouth 3 (three) times daily. 05/02/17   Shawnee Knapp, MD  propranolol ER (INDERAL LA) 60 MG 24 hr capsule Take 1 capsule (60 mg total) by mouth daily. 05/02/17   Shawnee Knapp, MD  SYNTHROID 125 MCG tablet Take 1 tablet (125 mcg total) by mouth daily before breakfast. 01/15/18   Shawnee Knapp, MD    Family History Family History  Problem Relation Age of Onset  . Arthritis Mother   . Ovarian cancer Mother 24  . Hyperlipidemia Mother   . Anxiety disorder Mother   . Depression Mother   . Arthritis Father   . Anxiety disorder Father   . Diabetes Father 63       type 2  .  Seizures Father   . Cirrhosis Brother        hep c  . Heart disease Maternal Grandmother   . Heart disease Maternal Grandfather     Social History Social History   Tobacco Use  . Smoking status: Never Smoker  . Smokeless tobacco: Never Used  Substance Use Topics  . Alcohol use: Yes    Alcohol/week: 1.8 - 2.4 oz    Types: 2 - 3 Glasses of wine, 1 Standard drinks or equivalent per week    Comment: rarely  . Drug use: No     Allergies   Codeine   Review of Systems Review of Systems  Constitutional: Negative for activity change, appetite change, chills, fatigue and fever.  HENT: Negative for congestion, ear pain, rhinorrhea, sinus pressure, sore throat and trouble swallowing.   Eyes: Negative for discharge and redness.  Respiratory: Positive for cough. Negative for chest tightness and shortness of breath.   Cardiovascular: Negative for chest pain.  Gastrointestinal: Negative for abdominal pain, diarrhea, nausea and vomiting.  Musculoskeletal: Negative for  myalgias.  Skin: Negative for rash.  Neurological: Negative for dizziness, light-headedness and headaches.     Physical Exam Triage Vital Signs ED Triage Vitals [04/11/18 1506]  Enc Vitals Group     BP 121/73     Pulse Rate 70     Resp 16     Temp 98.3 F (36.8 C)     Temp Source Oral     SpO2 99 %     Weight      Height      Head Circumference      Peak Flow      Pain Score      Pain Loc      Pain Edu?      Excl. in [place]?    No data found.  Updated Vital Signs BP 121/73 (BP Location: Left Arm)   Pulse 70   Temp 98.3 F (36.8 C) (Oral)   Resp 16   LMP 04/11/2017   SpO2 99%   Visual Acuity Right Eye Distance:   Left Eye Distance:   Bilateral Distance:    Right Eye Near:   Left Eye Near:    Bilateral Near:     Physical Exam  Constitutional: She appears well-developed and well-nourished. No distress.  HENT:  Head: Normocephalic and atraumatic.  Mouth/Throat: Oropharynx is clear and moist.  Bilateral ears without tenderness to palpation of external auricle, tragus and mastoid, EAC's without erythema or swelling, TM's with good bony landmarks and cone of light. Non erythematous.   Oral mucosa pink and moist, no tonsillar enlargement or exudate. Posterior pharynx patent and nonerythematous, no uvula deviation or swelling. Normal phonation.  Eyes: Conjunctivae are normal.  Neck: Neck supple.  Cardiovascular: Normal rate and regular rhythm.  No murmur heard. Pulmonary/Chest: Effort normal and breath sounds normal. No respiratory distress.  Breathing comfortably at rest, CTABL, no wheezing, rales or other adventitious sounds auscultated  Abdominal: Soft. There is no tenderness.  Musculoskeletal: She exhibits no edema.  Neurological: She is alert.  Skin: Skin is warm and dry.  Psychiatric: She has a normal mood and affect.  Nursing note and vitals reviewed.    UC Treatments / Results  Labs (all labs ordered are listed, but only abnormal results are  displayed) Labs Reviewed - No data to display  EKG None  Radiology Dg Chest 2 View  Result Date: 04/11/2018 CLINICAL DATA:  Follow-up pneumonia. EXAM: CHEST - 2 VIEW  COMPARISON:  03/13/2018 and prior radiographs FINDINGS: The cardiomediastinal silhouette is unremarkable. RIGHT middle lobe airspace disease has resolved with minimal residual atelectasis/scarring. There is no evidence of focal airspace disease, pulmonary edema, suspicious pulmonary nodule/mass, pleural effusion, or pneumothorax. No acute bony abnormalities are identified. IMPRESSION: Resolved RIGHT middle lobe airspace disease/pneumonia with minimal residual atelectasis/scarring. Electronically Signed   By: Margarette Canada M.D.   On: 04/11/2018 15:23    Procedures Procedures (including critical care time)  Medications Ordered in UC Medications - No data to display  Initial Impression / Assessment and Plan / UC Course  I have reviewed the triage vital signs and the nursing notes.  Pertinent labs & imaging results that were available during my care of the patient were reviewed by me and considered in my medical decision making (see chart for details).     Lungs CTA BL, vital signs stable, x-ray showing resolved pneumonia, no persistent abnormalities.  Patient also clinically improving.  Recommend patient to continue to use OTC management for her occasional cough, follow-up if symptoms returning or worsening.Discussed strict return precautions. Patient verbalized understanding and is agreeable with plan.  Final Clinical Impressions(s) / UC Diagnoses   Final diagnoses:  Follow up  Pneumonia of right middle lobe due to infectious organism [facility])     Discharge Instructions     Pneumonia appears to have resolved Follow up if symptoms returning   ED Prescriptions    None     Controlled Substance Prescriptions [place] Controlled Substance Registry consulted? Not Applicable   Janith Lima, [place] 04/11/18 1548

## 2018-04-17 MED FILL — SYNTHROID 125 MCG TABLET: 125 | 90 days supply | Qty: 90 | Fill #1

## 2018-04-18 MED FILL — PROPRANOLOL 10 MG TABLET: 10 | 30 days supply | Qty: 90 | Fill #5

## 2018-05-09 MED FILL — PRIMIDONE 50 MG TABLET: 50 | 90 days supply | Qty: 180 | Fill #1

## 2018-06-03 ENCOUNTER — Encounter: Payer: Self-pay | Admitting: Family Medicine

## 2018-06-03 ENCOUNTER — Other Ambulatory Visit: Payer: Self-pay | Admitting: Family Medicine

## 2018-06-04 MED ORDER — PROPRANOLOL HCL 10 MG PO TABS: 10.0000 mg | ORAL_TABLET | Freq: Three times a day (TID) | ORAL | 5 refills | Status: DC

## 2018-06-04 MED ORDER — BUPROPION HCL ER (XL) 300 MG PO TB24: 300.0000 mg | ORAL_TABLET | Freq: Every day | ORAL | 0 refills | Status: DC

## 2018-06-04 MED ORDER — PROPRANOLOL HCL ER 60 MG PO CP24: 60.0000 mg | ORAL_CAPSULE | Freq: Every day | ORAL | 1 refills | Status: DC

## 2018-06-04 MED FILL — buPROPion HCL ER (XL) 300 M: 300 | 90 days supply | Qty: 90 | Fill #0

## 2018-06-04 MED FILL — PROPRANOLOL ER 60 MG CAP: 60 | 90 days supply | Qty: 90 | Fill #0

## 2018-06-04 MED FILL — PROPRANOLOL 10 MG TABLET: 10 | 30 days supply | Qty: 90 | Fill #0

## 2018-07-04 DIAGNOSIS — H43811 Vitreous degeneration, right eye: Secondary | ICD-10-CM | POA: Diagnosis not present

## 2018-07-31 MED FILL — SYNTHROID 125 MCG TABLET: 125 | 90 days supply | Qty: 90 | Fill #2

## 2018-07-31 MED FILL — PROPRANOLOL 10 MG TABLET: 10 | 30 days supply | Qty: 90 | Fill #1

## 2018-08-05 ENCOUNTER — Ambulatory Visit: Payer: 59 | Admitting: Family Medicine

## 2018-08-05 ENCOUNTER — Other Ambulatory Visit: Payer: Self-pay

## 2018-08-05 ENCOUNTER — Encounter: Payer: Self-pay | Admitting: Family Medicine

## 2018-08-05 VITALS — BP 132/89 | HR 68 | Temp 98.4°F | Resp 16 | Ht 64.96 in | Wt 183.0 lb

## 2018-08-05 DIAGNOSIS — Z6830 Body mass index (BMI) 30.0-30.9, adult: Secondary | ICD-10-CM

## 2018-08-05 DIAGNOSIS — F419 Anxiety disorder, unspecified: Secondary | ICD-10-CM | POA: Diagnosis not present

## 2018-08-05 DIAGNOSIS — F5101 Primary insomnia: Secondary | ICD-10-CM | POA: Diagnosis not present

## 2018-08-05 DIAGNOSIS — R5383 Other fatigue: Secondary | ICD-10-CM | POA: Diagnosis not present

## 2018-08-05 DIAGNOSIS — Z79899 Other long term (current) drug therapy: Secondary | ICD-10-CM

## 2018-08-05 DIAGNOSIS — E039 Hypothyroidism, unspecified: Secondary | ICD-10-CM | POA: Diagnosis not present

## 2018-08-05 DIAGNOSIS — G250 Essential tremor: Secondary | ICD-10-CM

## 2018-08-05 DIAGNOSIS — E6609 Other obesity due to excess calories: Secondary | ICD-10-CM

## 2018-08-05 DIAGNOSIS — F329 Major depressive disorder, single episode, unspecified: Secondary | ICD-10-CM | POA: Diagnosis not present

## 2018-08-05 DIAGNOSIS — R498 Other voice and resonance disorders: Secondary | ICD-10-CM | POA: Diagnosis not present

## 2018-08-05 MED ORDER — PRIMIDONE 250 MG PO TABS: 250.0000 mg | ORAL_TABLET | Freq: Every day | ORAL | 3 refills | Status: DC

## 2018-08-05 MED ORDER — BUPROPION HCL ER (XL) 300 MG PO TB24: 300.0000 mg | ORAL_TABLET | Freq: Every day | ORAL | 3 refills | Status: DC

## 2018-08-05 MED FILL — PRIMIDONE 250 MG TABLET: 250 | 90 days supply | Qty: 90 | Fill #0

## 2018-08-05 NOTE — Progress Notes (Signed)
Subjective:    Patient: Katherine Garrett  DOB: September 05, 1966; 52 y.o.   MRN: [id number]  Chief Complaint  Patient presents with  . Hypothyroidism    follow-up    HPI Last saw pt 10 mos prior for CPE Benign head and vocal tremor: She was controlled on propranolol LA 60 and IR 10 TID, but causing fatigue and exercising limitation so added in topiramate with hopes of also benefiting attempts for weight loss.  Topiramate helped with her head tremor but not her voice and she was concerned was causing severe bone/joint pain as well as mental fogginess so weaned off and did trial of primidone qhs and weaned up to 100mg  instead at her last visit 10 mos ago. , and consider neurology evaluation.  Severity of vocal tremor varies depending on level of stress.  Severe bone pain releave after topirmate was stopped.  Energy increases when she skips the LA propranolol on her days off.  Depression: Had been on citalopram for 14 years, so changed to wellbutrin XL 300mg  for weight loss and energy. Her mood has fine. Never sleeping well which is her baseline. No panic episodes but does have anxiety which she attributes to work stress.  Sleep and anxiety sxs have NOT worsened on the wellbutrin. Failed melatonin for sleep as wakes freq.  Hypothyroidism: Doing well on current synthroid dose. Needs brand name only.  Is taking vitamin D 1000iu qd (level checked 7 yrs ago nml at 59) and tried supplement for help flashes which has helped decrease the intensity.  No paresthesias or tongue burning/taste changes.  + fatigue which she attributes to medications. Medical History Past Medical History:  Diagnosis Date  . Anxiety   . Depression   . 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 Hypothyroid dx 1998  . Insomnia 13-Dec-2002 onset   following death of spouse  . Migraines   . 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 Urine, incontinence, stress female    Past Surgical History:  Procedure Laterality Date  . Child birth  82 & 68   x's 2  .  TONSILLECTOMY  1970   Current Outpatient Medications on File Prior to Visit  Medication Sig Dispense Refill  . azithromycin (ZITHROMAX) 250 MG tablet Take 1 tablet (250 mg total) by mouth daily. Take first 2 tablets together, then 1 every day until finished. 6 tablet 0  . buPROPion (WELLBUTRIN XL) 300 MG 24 hr tablet Take 1 tablet (300 mg total) by mouth daily. Office visit needed 90 tablet 0  . doxycycline (VIBRA-TABS) 100 MG tablet Take 1 tablet (100 mg total) by mouth 2 (two) times daily. 20 tablet 0  . ketoconazole (NIZORAL) 2 % cream Apply 1 application topically 2 (two) times daily. 30 g 1  . primidone (MYSOLINE) 50 MG tablet Take 2 tablets (100 mg total) by mouth at bedtime. 180 tablet 1  . propranolol (INDERAL) 10 MG tablet Take 1 tablet (10 mg total) by mouth 3 (three) times daily. 90 tablet 5  . propranolol ER (INDERAL LA) 60 MG 24 hr capsule Take 1 capsule (60 mg total) by mouth daily. 90 capsule 1  . SYNTHROID 125 MCG tablet Take 1 tablet (125 mcg total) by mouth daily before breakfast. 90 tablet 3   No current facility-administered medications on file prior to visit.    Allergies  Allergen Reactions  . Codeine Nausea And Vomiting   Family History  Problem Relation Age of Onset  . Arthritis Mother   . Ovarian  cancer Mother 101  . Hyperlipidemia Mother   . Anxiety disorder Mother   . Depression Mother   . Arthritis Father   . Anxiety disorder Father   . Diabetes Father 60       type 2  . Seizures Father   . Cirrhosis Brother        hep c  . Heart disease Maternal Grandmother   . Heart disease Maternal Grandfather    Social History   Socioeconomic History  . Marital status: Widowed    Spouse name: Not on file  . Number of children: Not on file  . Years of education: Not on file  . Highest education level: Not on file  Occupational History  . Not on file  Social Needs  . Financial resource strain: Not on file  . Food insecurity:    Worry: Not on file     Inability: Not on file  . Transportation needs:    Medical: Not on file    Non-medical: Not on file  Tobacco Use  . Smoking status: Never Smoker  . Smokeless tobacco: Never Used  Substance and Sexual Activity  . Alcohol use: Yes    Alcohol/week: 3.0 - 4.0 standard drinks    Types: 2 - 3 Glasses of wine, 1 Standard drinks or equivalent per week    Comment: rarely  . Drug use: No  . Sexual activity: Never    Partners: Male    Birth control/protection: OCP  Lifestyle  . Physical activity:    Days per week: Not on file    Minutes per session: Not on file  . Stress: Not on file  Relationships  . Social connections:    Talks on phone: Not on file    Gets together: Not on file    Attends religious service: Not on file    Active member of club or organization: Not on file    Attends meetings of clubs or organizations: Not on file    Relationship status: Not on file  Other Topics Concern  . Not on file  Social History Narrative   Husband died in 03-Dec-2002 from stomach cancer.   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)   Depression screen [facility] 2/9 05/02/2017 08/24/2016  Decreased Interest 1 0  Down, Depressed, Hopeless 1 0  PHQ - 2 Score 2 0  Altered sleeping 3 -  Tired, decreased energy 3 -  Change in appetite 0 -  Feeling bad or failure about yourself  0 -  Trouble concentrating 2 -  Moving slowly or fidgety/restless 0 -  Suicidal thoughts 0 -  PHQ-9 Score 10 -    ROS As noted in HPI  Objective:  BP 132/89   Pulse 68   Temp 98.4 F (36.9 C) (Oral)   Resp 16   Ht 5' 4.96" (1.65 m)   Wt 183 lb (83 kg)   LMP 04/11/2017   SpO2 95%   BMI 30.49 kg/m  Physical Exam  Constitutional: She is oriented to person, place, and time. She appears well-developed and well-nourished. No distress.  HENT:  Head: Normocephalic and atraumatic.  Right Ear: External ear normal.  Left Ear: External ear normal.  Eyes: Conjunctivae are normal. No scleral  icterus.  Neck: Normal range of motion. Neck supple. No thyromegaly present.  Cardiovascular: Normal rate, regular rhythm, normal heart sounds and intact distal pulses.  Pulmonary/Chest: Effort normal and breath sounds normal. No respiratory distress.  Musculoskeletal:  She exhibits no edema.  Lymphadenopathy:    She has no cervical adenopathy.  Neurological: She is alert and oriented to person, place, and time.  Skin: Skin is warm and dry. She is not diaphoretic. No erythema.  Psychiatric: She has a normal mood and affect. Her behavior is normal.    Assessment & Plan:   1. Hypothyroidism, unspecified type   2. Fatigue, unspecified type   3. Encounter for long-term current use of medication   4. Benign head tremor   5. Primary insomnia   6. Vocal tremor   7. Reactive depression   8. Anxiety   9. Class 1 obesity due to excess calories without serious comorbidity with body mass index (BMI) of 30.0 to 30.9 in adult     Ok to refill the propranolol LA and IR whenever requested  Patient will continue on current chronic medications other than changes noted above, so ok to refill when needed.   See after visit summary for patient specific instructions.  Orders Placed This Encounter  Procedures  . Comprehensive metabolic panel  . TSH  . Vitamin B12  . CBC with Differential/Platelet    Meds ordered this encounter  Medications  . primidone (MYSOLINE) 250 MG tablet    Sig: Take 1 tablet (250 mg total) by mouth at bedtime.    Dispense:  90 tablet    Refill:  3  . buPROPion (WELLBUTRIN XL) 300 MG 24 hr tablet    Sig: Take 1 tablet (300 mg total) by mouth daily.    Dispense:  90 tablet    Refill:  3    Patient verbalized to me that they understand the following: diagnosis, what is being done for them, what to expect and what should be done at home.  Their questions have been answered. They understand that I am unable to predict every possible medication interaction or adverse  outcome and that if any unexpected symptoms arise, they should contact us and their pharmacist, as well as never hesitate to seek urgent/emergent care at [facility] or ER if they think it might be warranted.    Delman Cheadle, MD, MPH [facility] [address] [place], [place]  [address] [phone] Office phone  [phone] Office fax  08/05/18 9:26 AM

## 2018-08-05 NOTE — Patient Instructions (Addendum)
Prrimidone taper up: Start 1/2 tab of the 250mg  at night x 1 wk Then 1/2 of 250mg  + 1/2 of the 50mg  x 1 wk Then 1/2 tab of 250mg  tab + 1 tab of the 50mg  x 1 wk Then 1/2 tab of the 250mg  tab + 1 & 1/2 tabs of the 50mg  x 1 wk Then 1/2 tab of the 250mg  tab + 2 tabs of the 50mg  x 1 wk Then 1 250mg  tab at night.    If you have lab work done today you will be contacted with your lab results within the next 2 weeks.  If you have not heard from [place] then please contact us. The fastest way to get your results is to register for My Chart.   IF you received an x-ray today, you will receive an invoice from [facility]. Please contact [facility] at [phone] with questions or concerns regarding your invoice.   IF you received labwork today, you will receive an invoice from [place]. Please contact [facility] at [phone] with questions or concerns regarding your invoice.   Our billing staff will not be able to assist you with questions regarding bills from these companies.  You will be contacted with the lab results as soon as they are available. The fastest way to get your results is to activate your My Chart account. Instructions are located on the last page of this paperwork. If you have not heard from [place] regarding the results in 2 weeks, please contact this office.     Living With Anxiety After being diagnosed with an anxiety disorder, you may be relieved to know why you have felt or behaved a certain way. It is natural to also feel overwhelmed about the treatment ahead and what it will mean for your life. With care and support, you can manage this condition and recover from it. How to cope with anxiety Dealing with stress Stress is your body's reaction to life changes and events, both good and bad. Stress can last just a few hours or it can be ongoing. Stress can play a major role in anxiety, so it is important to learn both how to cope with stress and how to think about it  differently. Talk with your health care provider or a counselor to learn more about stress reduction. He or she may suggest some stress reduction techniques, such as:  Music therapy. This can include creating or listening to music that you enjoy and that inspires you.  Mindfulness-based meditation. This involves being aware of your normal breaths, rather than trying to control your breathing. It can be done while sitting or walking.  Centering prayer. This is a kind of meditation that involves focusing on a word, phrase, or sacred image that is meaningful to you and that brings you peace.  Deep breathing. To do this, expand your stomach and inhale slowly through your nose. Hold your breath for 3-5 seconds. Then exhale slowly, allowing your stomach muscles to relax.  Self-talk. This is a skill where you identify thought patterns that lead to anxiety reactions and correct those thoughts.  Muscle relaxation. This involves tensing muscles then relaxing them.  Choose a stress reduction technique that fits your lifestyle and personality. Stress reduction techniques take time and practice. Set aside 5-15 minutes a day to do them. Therapists can offer training in these techniques. The training may be covered by some insurance plans. Other things you can do to manage stress include:  Keeping a stress diary. This  can help you learn what triggers your stress and ways to control your response.  Thinking about how you respond to certain situations. You may not be able to control everything, but you can control your reaction.  Making time for activities that help you relax, and not feeling guilty about spending your time in this way.  Therapy combined with coping and stress-reduction skills provides the best chance for successful treatment. Medicines Medicines can help ease symptoms. Medicines for anxiety include:  Anti-anxiety drugs.  Antidepressants.  Beta-blockers.  Medicines may be used as the  main treatment for anxiety disorder, along with therapy, or if other treatments are not working. Medicines should be prescribed by a health care provider. Relationships Relationships can play a big part in helping you recover. Try to spend more time connecting with trusted friends and family members. Consider going to couples counseling, taking family education classes, or going to family therapy. Therapy can help you and others better understand the condition. How to recognize changes in your condition Everyone has a different response to treatment for anxiety. Recovery from anxiety happens when symptoms decrease and stop interfering with your daily activities at home or work. This may mean that you will start to:  Have better concentration and focus.  Sleep better.  Be less irritable.  Have more energy.  Have improved memory.  It is important to recognize when your condition is getting worse. Contact your health care provider if your symptoms interfere with home or work and you do not feel like your condition is improving. Where to find help and support: You can get help and support from these sources:  Self-help groups.  Online and [employer].  A trusted spiritual leader.  Couples counseling.  Family education classes.  Family therapy.  Follow these instructions at home:  Eat a healthy diet that includes plenty of vegetables, fruits, whole grains, low-fat dairy products, and lean protein. Do not eat a lot of foods that are high in solid fats, added sugars, or salt.  Exercise. Most adults should do the following: ? Exercise for at least 150 minutes each week. The exercise should increase your heart rate and make you sweat (moderate-intensity exercise). ? Strengthening exercises at least twice a week.  Cut down on caffeine, tobacco, alcohol, and other potentially harmful substances.  Get the right amount and quality of sleep. Most adults need 7-9 hours of sleep  each night.  Make choices that simplify your life.  Take over-the-counter and prescription medicines only as told by your health care provider.  Avoid caffeine, alcohol, and certain over-the-counter cold medicines. These may make you feel worse. Ask your pharmacist which medicines to avoid.  Keep all follow-up visits as told by your health care provider. This is important. Questions to ask your health care provider  Would I benefit from therapy?  How often should I follow up with a health care provider?  How long do I need to take medicine?  Are there any long-term side effects of my medicine?  Are there any alternatives to taking medicine? Contact a health care provider if:  You have a hard time staying focused or finishing daily tasks.  You spend many hours a day feeling worried about everyday life.  You become exhausted by worry.  You start to have headaches, feel tense, or have nausea.  You urinate more than normal.  You have diarrhea. Get help right away if:  You have a racing heart and shortness of breath.  You have  thoughts of hurting yourself or others. If you ever feel like you may hurt yourself or others, or have thoughts about taking your own life, get help right away. You can go to your nearest emergency department or call:  Your local emergency services (911 in the [place]  A suicide crisis helpline, such as the [facility] at 804-144-5071. This is open 24-hours a day.  Summary  Taking steps to deal with stress can help calm you.  Medicines cannot cure anxiety disorders, but they can help ease symptoms.  Family, friends, and partners can play a big part in helping you recover from an anxiety disorder. This information is not intended to replace advice given to you by your health care provider. Make sure you discuss any questions you have with your health care provider. Document Released: 08/28/2016 Document Revised: 08/28/2016  Document Reviewed: 08/28/2016 Elsevier Interactive Patient Education  [employer].

## 2018-08-06 LAB — CBC WITH DIFFERENTIAL/PLATELET
Basophils Absolute: 0 10*3/uL (ref 0.0–0.2)
Basos: 1 %
EOS (ABSOLUTE): 0.1 10*3/uL (ref 0.0–0.4)
Eos: 3 %
Hematocrit: 42.6 % (ref 34.0–46.6)
Hemoglobin: 14 g/dL (ref 11.1–15.9)
Immature Grans (Abs): 0 10*3/uL (ref 0.0–0.1)
Immature Granulocytes: 0 %
Lymphocytes Absolute: 1.3 10*3/uL (ref 0.7–3.1)
Lymphs: 28 %
MCH: 29.7 pg (ref 26.6–33.0)
MCHC: 32.9 g/dL (ref 31.5–35.7)
MCV: 90 fL (ref 79–97)
Monocytes Absolute: 0.4 10*3/uL (ref 0.1–0.9)
Monocytes: 8 %
Neutrophils Absolute: 2.7 10*3/uL (ref 1.4–7.0)
Neutrophils: 60 %
Platelets: 236 10*3/uL (ref 150–450)
RBC: 4.72 x10E6/uL (ref 3.77–5.28)
RDW: 13.4 % (ref 12.3–15.4)
WBC: 4.6 10*3/uL (ref 3.4–10.8)

## 2018-08-06 LAB — COMPREHENSIVE METABOLIC PANEL
ALT: 18 IU/L (ref 0–32)
AST: 18 IU/L (ref 0–40)
Albumin/Globulin Ratio: 1.8 (ref 1.2–2.2)
Albumin: 4.4 g/dL (ref 3.5–5.5)
Alkaline Phosphatase: 86 IU/L (ref 39–117)
BUN/Creatinine Ratio: 16 (ref 9–23)
BUN: 13 mg/dL (ref 6–24)
Bilirubin Total: 0.3 mg/dL (ref 0.0–1.2)
CO2: 24 mmol/L (ref 20–29)
Calcium: 9.5 mg/dL (ref 8.7–10.2)
Chloride: 105 mmol/L (ref 96–106)
Creatinine, Ser: 0.8 mg/dL (ref 0.57–1.00)
GFR calc Af Amer: 97 mL/min/{1.73_m2} (ref >59)
GFR calc non Af Amer: 84 mL/min/{1.73_m2} (ref >59)
Globulin, Total: 2.5 g/dL (ref 1.5–4.5)
Glucose: 87 mg/dL (ref 65–99)
Potassium: 4.5 mmol/L (ref 3.5–5.2)
Sodium: 143 mmol/L (ref 134–144)
Total Protein: 6.9 g/dL (ref 6.0–8.5)

## 2018-08-06 LAB — TSH: TSH: 2.94 u[IU]/mL (ref 0.450–4.500)

## 2018-08-06 LAB — VITAMIN B12: Vitamin B-12: 443 pg/mL (ref 232–1245)

## 2018-09-03 MED FILL — buPROPion HCL ER (XL) 300 M: 300 | 90 days supply | Qty: 90 | Fill #0

## 2018-10-20 MED FILL — PROPRANOLOL 10 MG TABLET: 10 | 30 days supply | Qty: 90 | Fill #2

## 2018-10-27 MED FILL — SYNTHROID 125 MCG TABLET: 125 | 90 days supply | Qty: 90 | Fill #3

## 2018-11-18 ENCOUNTER — Telehealth: Payer: Self-pay | Admitting: Family Medicine

## 2018-11-18 NOTE — Telephone Encounter (Signed)
mychart message sent to pt about their appointment with Dr Brigitte Pulse

## 2018-11-21 DIAGNOSIS — H5213 Myopia, bilateral: Secondary | ICD-10-CM | POA: Diagnosis not present

## 2018-11-27 MED FILL — buPROPion HCL ER (XL) 300 M: 300 | 90 days supply | Qty: 90 | Fill #1

## 2018-11-27 MED FILL — PROPRANOLOL 10 MG TABLET: 10 | 30 days supply | Qty: 90 | Fill #3

## 2018-11-27 MED FILL — PROPRANOLOL ER 60 MG CAP: 60 | 90 days supply | Qty: 90 | Fill #1

## 2018-11-27 MED FILL — PRIMIDONE 250 MG TABLET: 250 | 90 days supply | Qty: 90 | Fill #1

## 2019-01-13 ENCOUNTER — Telehealth: Payer: 59 | Admitting: Family Medicine

## 2019-01-22 ENCOUNTER — Encounter: Payer: 59 | Admitting: Family Medicine

## 2019-01-29 ENCOUNTER — Other Ambulatory Visit: Payer: Self-pay

## 2019-01-29 DIAGNOSIS — E039 Hypothyroidism, unspecified: Secondary | ICD-10-CM

## 2019-01-29 MED ORDER — SYNTHROID 125 MCG PO TABS: 125.0000 ug | ORAL_TABLET | Freq: Every day | ORAL | 1 refills | Status: DC

## 2019-01-29 MED FILL — SYNTHROID 125 MCG TABLET: 125 | 30 days supply | Qty: 30 | Fill #0

## 2019-02-04 ENCOUNTER — Other Ambulatory Visit: Payer: Self-pay | Admitting: Family Medicine

## 2019-02-04 ENCOUNTER — Other Ambulatory Visit: Payer: 59

## 2019-02-04 ENCOUNTER — Telehealth: Payer: Self-pay | Admitting: Family Medicine

## 2019-02-04 DIAGNOSIS — R6889 Other general symptoms and signs: Secondary | ICD-10-CM

## 2019-02-04 NOTE — Telephone Encounter (Signed)
Pt called after she had filingl out health at work survey , she stated that she has sore throat ,headache and  coughing over the last couple of days.She said that she hasn't had a fever however her symptoms have gotten worse. Put in order for pt tested for covid  Gave pt testing site information, [facility]/04/19.

## 2019-02-13 MED FILL — PROPRANOLOL 10 MG TABLET: 10 | 30 days supply | Qty: 90 | Fill #4

## 2019-02-27 ENCOUNTER — Encounter: Payer: 59 | Admitting: Family Medicine

## 2019-03-11 ENCOUNTER — Telehealth: Payer: Self-pay | Admitting: Family Medicine

## 2019-03-11 NOTE — Telephone Encounter (Signed)
Patient needs synthroid refilled until her appnt July 6th  Would like a phone call   [place]

## 2019-03-16 ENCOUNTER — Other Ambulatory Visit: Payer: Self-pay | Admitting: *Deleted

## 2019-03-16 DIAGNOSIS — E039 Hypothyroidism, unspecified: Secondary | ICD-10-CM

## 2019-03-16 MED ORDER — SYNTHROID 125 MCG PO TABS: 125.0000 ug | ORAL_TABLET | Freq: Every day | ORAL | 0 refills | Status: DC

## 2019-03-16 MED FILL — SYNTHROID 125 MCG TABLET: 125 | 30 days supply | Qty: 30 | Fill #0

## 2019-03-23 ENCOUNTER — Other Ambulatory Visit: Payer: Self-pay

## 2019-03-23 ENCOUNTER — Encounter: Payer: Self-pay | Admitting: Family Medicine

## 2019-03-23 ENCOUNTER — Ambulatory Visit: Payer: 59 | Admitting: Family Medicine

## 2019-03-23 VITALS — BP 131/83 | HR 70 | Temp 98.1°F | Resp 18 | Ht 64.0 in | Wt 189.4 lb

## 2019-03-23 DIAGNOSIS — Z1211 Encounter for screening for malignant neoplasm of colon: Secondary | ICD-10-CM

## 2019-03-23 DIAGNOSIS — F4322 Adjustment disorder with anxiety: Secondary | ICD-10-CM | POA: Diagnosis not present

## 2019-03-23 DIAGNOSIS — Z0000 Encounter for general adult medical examination without abnormal findings: Secondary | ICD-10-CM

## 2019-03-23 DIAGNOSIS — F5101 Primary insomnia: Secondary | ICD-10-CM | POA: Diagnosis not present

## 2019-03-23 DIAGNOSIS — Z0001 Encounter for general adult medical examination with abnormal findings: Secondary | ICD-10-CM

## 2019-03-23 DIAGNOSIS — E034 Atrophy of thyroid (acquired): Secondary | ICD-10-CM | POA: Diagnosis not present

## 2019-03-23 NOTE — Progress Notes (Signed)
Chief Complaint  Patient presents with  . Transitions Of Care    wants to discuss getting a physical for work    Subjective:  Katherine Garrett is a 53 y.o. female here for a health maintenance visit.  Patient is established pt  Patient denies having colonoscopy No history of crohn's disease or UC No blood per rectum No unexplained weight loss     Patient Active Problem List   Diagnosis Date Noted  . Vocal tremor 08/05/2018  . Insomnia 08/24/2016  . Obese 05/12/2015  . Hypothyroid 04/15/2011  . Benign head tremor 04/15/2011  . Depression 04/15/2011  . Anxiety 04/15/2011    Past Medical History:  Diagnosis Date  . Anxiety   . Depression   . 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 Hypothyroid dx 1998  . Insomnia 11-24-02 onset   following death of spouse  . Migraines   . 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 Urine, incontinence, stress female     Past Surgical History:  Procedure Laterality Date  . Child birth  84 & 87   x's 2  . TONSILLECTOMY  1970     Outpatient Medications Prior to Visit  Medication Sig Dispense Refill  . buPROPion (WELLBUTRIN XL) 300 MG 24 hr tablet Take 1 tablet (300 mg total) by mouth daily. 90 tablet 3  . esomeprazole (NEXIUM) 40 MG capsule Take 40 mg by mouth daily at 12 noon.    Marland Kitchen ketoconazole (NIZORAL) 2 % cream Apply 1 application topically 2 (two) times daily. 30 g 1  . primidone (MYSOLINE) 250 MG tablet Take 1 tablet (250 mg total) by mouth at bedtime. 90 tablet 3  . propranolol (INDERAL) 10 MG tablet Take 1 tablet (10 mg total) by mouth 3 (three) times daily. 90 tablet 5  . propranolol ER (INDERAL LA) 60 MG 24 hr capsule Take 1 capsule (60 mg total) by mouth daily. 90 capsule 1  . SYNTHROID 125 MCG tablet Take 1 tablet (125 mcg total) by mouth daily before breakfast. 30 tablet 0  . VITAMIN D PO Take by mouth.     No facility-administered medications prior to visit.     Allergies  Allergen Reactions  . Codeine Nausea And Vomiting     Family  History  Problem Relation Age of Onset  . Arthritis Mother   . Ovarian cancer Mother 76  . Hyperlipidemia Mother   . Anxiety disorder Mother   . Depression Mother   . Arthritis Father   . Anxiety disorder Father   . Diabetes Father 69       type 2  . Seizures Father   . Cirrhosis Brother        hep c  . Heart disease Maternal Grandmother   . Heart disease Maternal Grandfather      Health Habits: Dental Exam: up to date Eye Exam: up to date Exercise: 2 times/week on average Current exercise activities: walking Diet: balanced  Social History   Socioeconomic History  . Marital status: Widowed    Spouse name: Not on file  . Number of children: Not on file  . Years of education: Not on file  . Highest education level: Not on file  Occupational History  . Not on file  Social Needs  . Financial resource strain: Not on file  . Food insecurity    Worry: Not on file    Inability: Not on file  . Transportation needs    Medical: Not on file  Non-medical: Not on file  Tobacco Use  . Smoking status: Never Smoker  . Smokeless tobacco: Never Used  Substance and Sexual Activity  . Alcohol use: Yes    Alcohol/week: 3.0 - 4.0 standard drinks    Types: 2 - 3 Glasses of wine, 1 Standard drinks or equivalent per week    Comment: rarely  . Drug use: No  . Sexual activity: Never    Partners: Male    Birth control/protection: OCP  Lifestyle  . Physical activity    Days per week: Not on file    Minutes per session: Not on file  . Stress: Not on file  Relationships  . Social Herbalist on phone: Not on file    Gets together: Not on file    Attends religious service: Not on file    Active member of club or organization: Not on file    Attends meetings of clubs or organizations: Not on file    Relationship status: Not on file  . Intimate partner violence    Fear of current or ex partner: Not on file    Emotionally abused: Not on file    Physically abused: Not on  file    Forced sexual activity: Not on file  Other Topics Concern  . Not on file  Social History Narrative   Husband died in 11/11/2002 from stomach cancer.   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 Alcohol/week: 3.0 - 4.0 standard drinks  . Types: 2 - 3 Glasses of wine, 1 Standard drinks or equivalent per week   Comment: rarely   Social History   Tobacco Use  Smoking Status Never Smoker  Smokeless Tobacco Never Used   Social History   Substance and Sexual Activity  Drug Use No    GYN: Sexual Health Menstrual status: regular menses LMP: Patient's last menstrual period was 04/11/2017. Last pap smear: see HM section History of abnormal pap smears:  Sexually active: with female partner Current contraception: menopause  Health Maintenance: See under health Maintenance activity for review of completion dates as well. Immunization History  Administered Date(s) Administered  . Influenza Whole 05/28/2011  . Influenza,inj,Quad PF,6+ Mos 08/10/2013  . Influenza-Unspecified 06/17/2016, 06/17/2017, 05/23/2018  . Td 09/17/2002  . Tdap 05/28/2011      Depression Screen-PHQ2/9 Depression screen [facility] 2/9 03/23/2019 05/02/2017 08/24/2016  Decreased Interest 0 1 0  Down, Depressed, Hopeless 0 1 0  PHQ - 2 Score 0 2 0  Altered sleeping - 3 -  Tired, decreased energy - 3 -  Change in appetite - 0 -  Feeling bad or failure about yourself  - 0 -  Trouble concentrating - 2 -  Moving slowly or fidgety/restless - 0 -  Suicidal thoughts - 0 -  PHQ-9 Score - 10 -       Depression Severity and Treatment Recommendations:  0-4= None  5-9= Mild / Treatment: Support, educate to call if worse; return in one month  10-14= Moderate / Treatment: Support, watchful waiting; Antidepressant or Psycotherapy  15-19= Moderately severe / Treatment: Antidepressant OR Psychotherapy  >= 20 = Major  depression, severe / Antidepressant AND Psychotherapy    Review of Systems   ROS  See HPI for ROS as well.   Review of Systems  Constitutional: Negative for activity change, appetite change, chills and fever.  HENT: Negative  for congestion, nosebleeds, trouble swallowing and voice change.   Respiratory: Negative for cough, shortness of breath and wheezing.   Gastrointestinal: Negative for diarrhea, nausea and vomiting.  Genitourinary: Negative for difficulty urinating, dysuria, flank pain and hematuria.  Musculoskeletal: Negative for back pain, joint swelling and neck pain.  Neurological: Negative for dizziness, speech difficulty, light-headedness and numbness. +vocal tremor See HPI. All other review of systems negative.    Objective:   Vitals:   03/23/19 1435  BP: 131/83  Pulse: 70  Resp: 18  Temp: 98.1 F (36.7 C)  TempSrc: Oral  SpO2: 97%  Weight: 189 lb 6.4 oz (85.9 kg)  Height: '5\' 4"'$  (1.626 m)    Body mass index is 32.51 kg/m.  Physical Exam Constitutional:      Appearance: Normal appearance. She is normal weight.  HENT:     Head: Normocephalic and atraumatic.     Right Ear: Tympanic membrane normal.     Left Ear: Tympanic membrane normal.     Nose: Nose normal. No congestion.  Eyes:     Extraocular Movements: Extraocular movements intact.     Conjunctiva/sclera: Conjunctivae normal.  Neck:     Musculoskeletal: Normal range of motion and neck supple. No neck rigidity.  Cardiovascular:     Rate and Rhythm: Normal rate and regular rhythm.     Pulses: Normal pulses.     Heart sounds: Normal heart sounds.  Pulmonary:     Effort: Pulmonary effort is normal. No respiratory distress.     Breath sounds: Normal breath sounds. No stridor. No wheezing, rhonchi or rales.  Chest:     Chest wall: No tenderness.  Abdominal:     General: Bowel sounds are normal.     Palpations: Abdomen is soft.  Skin:    General: Skin is warm.     Capillary Refill: Capillary  refill takes less than 2 seconds.  Neurological:     Mental Status: She is alert.  Psychiatric:        Mood and Affect: Mood normal.        Behavior: Behavior normal.        Thought Content: Thought content normal.        Judgment: Judgment normal.        Assessment/Plan:   Patient was seen for a health maintenance exam.  Counseled the patient on health maintenance issues. Reviewed her health mainteance schedule and ordered appropriate tests (see orders.) Counseled on regular exercise and weight management. Recommend regular eye exams and dental cleaning.   The following issues were addressed today for health maintenance:   Brocha was seen today for transitions of care.  Diagnoses and all orders for this visit:  Encounter for health maintenance examination in adult  Screening for malignant neoplasm of colon -     Cologuard  Hypothyroidism due to acquired atrophy of thyroid -     TSH -     Lipid panel -     CMP14+EGFR -     CBC -     VITAMIN D 25 Hydroxy (Vit-D Deficiency, Fractures)  Adjustment disorder with anxious mood -     TSH -     CBC -     VITAMIN D 25 Hydroxy (Vit-D Deficiency, Fractures)  Primary insomnia -     VITAMIN D 25 Hydroxy (Vit-D Deficiency, Fractures)    Next visit will discuss hyperhidrosis, botox for vocal tremors, treatment for anxiety    No follow-ups on file.    Body mass  index is 32.51 kg/m.:  Discussed the patient's BMI with patient. The BMI body mass index is 32.51 kg/m.     No future appointments.  Patient Instructions       If you have lab work done today you will be contacted with your lab results within the next 2 weeks.  If you have not heard from [place] then please contact us. The fastest way to get your results is to register for My Chart.   IF you received an x-ray today, you will receive an invoice from [facility]. Please contact [facility] at [phone] with questions or concerns regarding your  invoice.   IF you received labwork today, you will receive an invoice from [place]. Please contact [facility] at [phone] with questions or concerns regarding your invoice.   Our billing staff will not be able to assist you with questions regarding bills from these companies.  You will be contacted with the lab results as soon as they are available. The fastest way to get your results is to activate your My Chart account. Instructions are located on the last page of this paperwork. If you have not heard from [place] regarding the results in 2 weeks, please contact this office.

## 2019-03-23 NOTE — Patient Instructions (Signed)
° ° ° °  If you have lab work done today you will be contacted with your lab results within the next 2 weeks.  If you have not heard from us then please contact us. The fastest way to get your results is to register for My Chart. ° ° °IF you received an x-ray today, you will receive an invoice from [place] [facility]. Please contact [place] [facility] at [phone] with questions or concerns regarding your invoice.  ° °IF you received labwork today, you will receive an invoice from [facility]. Please contact [facility] at [phone] with questions or concerns regarding your invoice.  ° °Our billing staff will not be able to assist you with questions regarding bills from these companies. ° °You will be contacted with the lab results as soon as they are available. The fastest way to get your results is to activate your My Chart account. Instructions are located on the last page of this paperwork. If you have not heard from us regarding the results in 2 weeks, please contact this office. °  ° ° ° °

## 2019-03-24 LAB — CMP14+EGFR
ALT: 26 IU/L (ref 0–32)
AST: 27 IU/L (ref 0–40)
Albumin/Globulin Ratio: 1.9 (ref 1.2–2.2)
Albumin: 4.4 g/dL (ref 3.8–4.9)
Alkaline Phosphatase: 89 IU/L (ref 39–117)
BUN/Creatinine Ratio: 16 (ref 9–23)
BUN: 14 mg/dL (ref 6–24)
Bilirubin Total: 0.2 mg/dL (ref 0.0–1.2)
CO2: 24 mmol/L (ref 20–29)
Calcium: 9.1 mg/dL (ref 8.7–10.2)
Chloride: 105 mmol/L (ref 96–106)
Creatinine, Ser: 0.87 mg/dL (ref 0.57–1.00)
GFR calc Af Amer: 87 mL/min/{1.73_m2} (ref >59)
GFR calc non Af Amer: 75 mL/min/{1.73_m2} (ref >59)
Globulin, Total: 2.3 g/dL (ref 1.5–4.5)
Glucose: 95 mg/dL (ref 65–99)
Potassium: 4.3 mmol/L (ref 3.5–5.2)
Sodium: 142 mmol/L (ref 134–144)
Total Protein: 6.7 g/dL (ref 6.0–8.5)

## 2019-03-24 LAB — CBC
Hematocrit: 40.6 % (ref 34.0–46.6)
Hemoglobin: 13.7 g/dL (ref 11.1–15.9)
MCH: 30 pg (ref 26.6–33.0)
MCHC: 33.7 g/dL (ref 31.5–35.7)
MCV: 89 fL (ref 79–97)
Platelets: 221 10*3/uL (ref 150–450)
RBC: 4.57 x10E6/uL (ref 3.77–5.28)
RDW: 13.6 % (ref 11.7–15.4)
WBC: 4.8 10*3/uL (ref 3.4–10.8)

## 2019-03-24 LAB — LIPID PANEL
Chol/HDL Ratio: 2.6 ratio (ref 0.0–4.4)
Cholesterol, Total: 233 mg/dL — ABNORMAL HIGH (ref 100–199)
HDL: 91 mg/dL (ref >39)
LDL Calculated: 122 mg/dL — ABNORMAL HIGH (ref 0–99)
Triglycerides: 100 mg/dL (ref 0–149)
VLDL Cholesterol Cal: 20 mg/dL (ref 5–40)

## 2019-03-24 LAB — VITAMIN D 25 HYDROXY (VIT D DEFICIENCY, FRACTURES): Vit D, 25-Hydroxy: 32.9 ng/mL (ref 30.0–100.0)

## 2019-03-24 LAB — TSH: TSH: 3.12 u[IU]/mL (ref 0.450–4.500)

## 2019-03-30 MED FILL — buPROPion HCL ER (XL) 300 M: 300 | 90 days supply | Qty: 90 | Fill #2

## 2019-03-30 MED FILL — PRIMIDONE 250 MG TABLET: 250 | 90 days supply | Qty: 90 | Fill #2

## 2019-04-06 ENCOUNTER — Ambulatory Visit: Payer: 59 | Admitting: Family Medicine

## 2019-04-06 MED FILL — PROPRANOLOL 10 MG TABLET: 10 | 30 days supply | Qty: 90 | Fill #5

## 2019-04-07 MED FILL — buPROPion HCL ER (XL) 300 M: 300 | 90 days supply | Qty: 90 | Fill #2

## 2019-04-08 ENCOUNTER — Encounter: Payer: Self-pay | Admitting: Family Medicine

## 2019-04-09 ENCOUNTER — Other Ambulatory Visit: Payer: Self-pay

## 2019-04-09 MED ORDER — PROPRANOLOL HCL ER 60 MG PO CP24: 60.0000 mg | ORAL_CAPSULE | Freq: Every day | ORAL | 0 refills | Status: DC

## 2019-04-09 MED FILL — PROPRANOLOL HCL ER 60 MG CP: 60 | 90 days supply | Qty: 90 | Fill #0

## 2019-04-09 MED FILL — SYNTHROID 125 MCG TABLET: 125 | 30 days supply | Qty: 30 | Fill #1

## 2019-04-13 DIAGNOSIS — Z1211 Encounter for screening for malignant neoplasm of colon: Secondary | ICD-10-CM | POA: Diagnosis not present

## 2019-04-21 ENCOUNTER — Encounter: Payer: Self-pay | Admitting: Family Medicine

## 2019-04-21 ENCOUNTER — Other Ambulatory Visit: Payer: Self-pay

## 2019-04-21 ENCOUNTER — Telehealth (INDEPENDENT_AMBULATORY_CARE_PROVIDER_SITE_OTHER): Payer: 59 | Admitting: Family Medicine

## 2019-04-21 VITALS — BP 127/82 | Temp 96.4°F | Ht 65.0 in | Wt 187.0 lb

## 2019-04-21 DIAGNOSIS — Z20828 Contact with and (suspected) exposure to other viral communicable diseases: Secondary | ICD-10-CM | POA: Diagnosis not present

## 2019-04-21 DIAGNOSIS — Z20822 Contact with and (suspected) exposure to covid-19: Secondary | ICD-10-CM

## 2019-04-21 DIAGNOSIS — R498 Other voice and resonance disorders: Secondary | ICD-10-CM

## 2019-04-21 DIAGNOSIS — Z7189 Other specified counseling: Secondary | ICD-10-CM

## 2019-04-21 MED ORDER — ALBUTEROL SULFATE HFA 108 (90 BASE) MCG/ACT IN AERS: 2.0000 | INHALATION_SPRAY | Freq: Four times a day (QID) | RESPIRATORY_TRACT | 0 refills | Status: DC | PRN

## 2019-04-21 MED ORDER — DIAZEPAM 2 MG PO TABS: 1.0000 mg | ORAL_TABLET | Freq: Two times a day (BID) | ORAL | 0 refills | Status: DC

## 2019-04-21 MED ORDER — AZITHROMYCIN 250 MG PO TABS: ORAL_TABLET | ORAL | 0 refills | Status: DC

## 2019-04-21 MED FILL — diazePAM 2 MG TABS: 2 | 30 days supply | Qty: 30 | Fill #0

## 2019-04-21 MED FILL — ALBUTEROL SULFATE HFA 108 (: 108 (90 BAS | 25 days supply | Qty: 18 | Fill #0

## 2019-04-21 MED FILL — AZITHROMYCIN 250 MG TABS: 250 | 5 days supply | Qty: 6 | Fill #0

## 2019-04-21 NOTE — Progress Notes (Signed)
Telemedicine Encounter- SOAP NOTE Established Patient  This telephone encounter was conducted with the patient's (or proxy's) verbal consent via audio telecommunications: yes/no: Yes Patient was instructed to have this encounter in a suitably private space; and to only have persons present to whom they give permission to participate. In addition, patient identity was confirmed by use of name plus two identifiers (DOB and address).  I discussed the limitations, risks, security and privacy concerns of performing an evaluation and management service by telephone and the availability of in person appointments. I also discussed with the patient that there may be a patient responsible charge related to this service. The patient expressed understanding and agreed to proceed.  I spent a total of TIME; 0 MIN TO 60 MIN: 25 minutes talking with the patient or their proxy.  No chief complaint on file. CC: runny nose, sneezing, dry cough  Subjective   Katherine MCELWAIN is a 53 y.o. established patient. 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 Vocal tremor 08/05/2018  . Insomnia 08/24/2016  . Obese 05/12/2015  . Hypothyroid 04/15/2011  . Benign head tremor 04/15/2011  . Depression 04/15/2011  . Anxiety  04/15/2011    Past Medical History:  Diagnosis Date  . Anxiety   . Depression   . 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 Hypothyroid dx 1998  . Insomnia 2002-12-18 onset   following death of spouse  . Migraines   . 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 Urine, incontinence, stress female     Current Outpatient Medications  Medication Sig Dispense Refill  . buPROPion (WELLBUTRIN XL) 300 MG 24 hr tablet Take 1 tablet (300 mg total) by mouth daily. 90 tablet 3  . esomeprazole (NEXIUM) 40 MG capsule Take 40 mg by mouth daily at 12 noon.    Marland Kitchen ketoconazole (NIZORAL) 2 % cream Apply 1 application topically 2 (two) times daily. 30 g 1  . propranolol (INDERAL) 10 MG tablet Take 1 tablet (10 mg total) by mouth 3 (three) times daily. 90 tablet 5  . propranolol ER (INDERAL LA) 60 MG 24 hr capsule Take 1 capsule (60 mg total) by mouth daily. 90 capsule 0  . SYNTHROID 125 MCG tablet Take 1 tablet (125 mcg total) by mouth daily before breakfast. 30 tablet 0  . albuterol (VENTOLIN HFA) 108 (90 Base) MCG/ACT inhaler Inhale 2 puffs into the lungs every 6 (six) hours as needed for wheezing or shortness of breath. 18 g 0  . azithromycin (ZITHROMAX) 250 MG tablet Take 2 tablets on day one then one tablet each day after 6 tablet 0  . diazepam (VALIUM) 2 MG tablet Take 0.5 tablets (1 mg total) by  mouth every 12 (twelve) hours. 30 tablet 0  . primidone (MYSOLINE) 250 MG tablet Take 1 tablet (250 mg total) by mouth at bedtime. (Patient not taking: Reported on 04/21/2019) 90 tablet 3  . VITAMIN D PO Take by mouth.     No current facility-administered medications for this visit.     Allergies  Allergen Reactions  . Codeine Nausea And Vomiting    Social History   Socioeconomic History  . Marital status: Widowed    Spouse name: Not on file  . Number of children: Not on file  . Years of education: Not on file  . Highest education level: Not on file  Occupational History  . Not on file  Social Needs  . Financial resource  strain: Not on file  . Food insecurity    Worry: Not on file    Inability: Not on file  . Transportation needs    Medical: Not on file    Non-medical: Not on file  Tobacco Use  . Smoking status: Never Smoker  . Smokeless tobacco: Never Used  Substance and Sexual Activity  . Alcohol use: Yes    Alcohol/week: 3.0 - 4.0 standard drinks    Types: 2 - 3 Glasses of wine, 1 Standard drinks or equivalent per week    Comment: rarely  . Drug use: No  . Sexual activity: Never    Partners: Male    Birth control/protection: OCP  Lifestyle  . Physical activity    Days per week: Not on file    Minutes per session: Not on file  . Stress: Not on file  Relationships  . Social Herbalist on phone: Not on file    Gets together: Not on file    Attends religious service: Not on file    Active member of club or organization: Not on file    Attends meetings of clubs or organizations: Not on file    Relationship status: Not on file  . Intimate partner violence    Fear of current or ex partner: Not on file    Emotionally abused: Not on file    Physically abused: Not on file    Forced sexual activity: Not on file  Other Topics Concern  . Not on file  Social History Narrative   Husband died in 12/08/02 from stomach cancer.   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)    ROS  See hpi  Objective   Cough is dry and raspy  Vitals as reported by the patient: Today's Vitals   04/21/19 1010  BP: 127/82  Temp: (!) 96.4 F (35.8 C)  TempSrc: Oral  Weight: 187 lb (84.8 kg)  Height: 5\' 5"  (1.651 m)    Diagnoses and all orders for this visit:  Exposure to Covid-19 Virus- a week since the onset of symptoms thus will repeat covid19 test Discussed continued isolation Repeat covid test Discussed zpak, albuterol and rest -     Novel Coronavirus, NAA ([facility]); Future  Advice Given About Covid-19 Virus by Telephone -     Novel Coronavirus, NAA ([facility]);  Future  Vocal tremor- will do a trial of valium 1mg  bid for vocal tremors Reviewed PDMP aware Discussed to continue propranolol for now  Other orders -     azithromycin (ZITHROMAX) 250 MG tablet; Take 2 tablets on day one then one tablet each day after -     albuterol (VENTOLIN HFA) 108 (90 Base) MCG/ACT  inhaler; Inhale 2 puffs into the lungs every 6 (six) hours as needed for wheezing or shortness of breath. -     diazepam (VALIUM) 2 MG tablet; Take 0.5 tablets (1 mg total) by mouth every 12 (twelve) hours.     I discussed the assessment and treatment plan with the patient. The patient was provided an opportunity to ask questions and all were answered. The patient agreed with the plan and demonstrated an understanding of the instructions.   The patient was advised to call back or seek an in-person evaluation if the symptoms worsen or if the condition fails to improve as anticipated.  I provided 25 minutes of non-face-to-face time during this encounter.  Forrest Moron, MD  Primary Care at [facility]

## 2019-04-22 LAB — COLOGUARD: Cologuard: NEGATIVE

## 2019-04-30 ENCOUNTER — Encounter: Payer: Self-pay | Admitting: Family Medicine

## 2019-05-14 ENCOUNTER — Other Ambulatory Visit: Payer: Self-pay | Admitting: Family Medicine

## 2019-05-14 DIAGNOSIS — E039 Hypothyroidism, unspecified: Secondary | ICD-10-CM

## 2019-05-14 MED FILL — SYNTHROID 125 MCG TABLET: 125 | 30 days supply | Qty: 30 | Fill #0

## 2019-05-14 NOTE — Telephone Encounter (Signed)
Requested medication (s) are due for refill today: yes  Requested medication (s) are on the active medication list: yes  Last refill:  04/21/2019  Future visit scheduled: no  Notes to clinic:  This refill cannot be delegated   Requested Prescriptions  Pending Prescriptions Disp Refills   diazepam (VALIUM) 2 MG tablet [Pharmacy Med Name: diazePAM 2 MG TABS 2 TAB] 30 tablet 0    Sig: TAKE 1/2 TABLET (1 MG TOTAL) BY MOUTH EVERY 12 (TWELVE) HOURS.     Not Delegated - Psychiatry:  Anxiolytics/Hypnotics Failed - 05/14/2019 11:43 AM      Failed - This refill cannot be delegated      Failed - Urine Drug Screen completed in last 360 days.      Passed - Valid encounter within last 6 months    Recent Outpatient Visits          1 month ago Encounter for health maintenance examination in adult   [facility] at [place], [place]   9 months ago Hypothyroidism, unspecified type   [facility], Laurey Arrow, MD   1 year ago Hypothyroidism, unspecified type   [facility], Laurey Arrow, MD   1 year ago Annual physical exam   [facility], Laurey Arrow, MD   2 years ago Hypothyroidism, unspecified type   [facility], Laurey Arrow, MD             Signed Prescriptions Disp Refills   SYNTHROID 125 MCG tablet 30 tablet 1    Sig: TAKE 1 TABLET (125 MCG TOTAL) BY MOUTH DAILY BEFORE BREAKFAST.     Endocrinology:  Hypothyroid Agents Failed - 05/14/2019 11:43 AM      Failed - TSH needs to be rechecked within 3 months after an abnormal result. Refill until TSH is due.      Passed - TSH in normal range and within 360 days    TSH  Date Value Ref Range Status  03/23/2019 3.120 0.450 - 4.500 uIU/mL Final         Passed - Valid encounter within last 12 months    Recent Outpatient Visits          1 month ago Encounter for health maintenance examination in adult   [facility] at [place], [place]   9 months ago Hypothyroidism, unspecified  type   [facility], Laurey Arrow, MD   1 year ago Hypothyroidism, unspecified type   [facility], Laurey Arrow, MD   1 year ago Annual physical exam   [facility], Laurey Arrow, MD   2 years ago Hypothyroidism, unspecified type   Primary Care at [facility], Laurey Arrow, MD

## 2019-05-18 ENCOUNTER — Other Ambulatory Visit: Payer: Self-pay | Admitting: Family Medicine

## 2019-05-18 MED ORDER — DIAZEPAM 2 MG PO TABS: 1.0000 mg | ORAL_TABLET | Freq: Two times a day (BID) | ORAL | 0 refills | Status: DC

## 2019-05-18 MED FILL — diazePAM 2 MG TABS: 2 | 30 days supply | Qty: 30 | Fill #0

## 2019-05-18 NOTE — Telephone Encounter (Signed)
pmp reviewed  Med refilled 

## 2019-05-18 NOTE — Telephone Encounter (Signed)
Requested medication (s) are due for refill today: yes  Requested medication (s) are on the active medication list: yes  Last refill:  04/21/2019  Future visit scheduled:  Notes to clinic:  This refill cannot be delegated  Requested Prescriptions  Pending Prescriptions Disp Refills   diazepam (VALIUM) 2 MG tablet [Pharmacy Med Name: diazePAM 2 MG TABS 2 TAB] 30 tablet 0    Sig: TAKE 1/2 TABLET (1 MG TOTAL) BY MOUTH EVERY 12 (TWELVE) HOURS.     Not Delegated - Psychiatry:  Anxiolytics/Hypnotics Failed - 05/18/2019 11:37 AM      Failed - This refill cannot be delegated      Failed - Urine Drug Screen completed in last 360 days.      Passed - Valid encounter within last 6 months    Recent Outpatient Visits          1 month ago Encounter for health maintenance examination in adult   [facility] at [place], [place]   9 months ago Hypothyroidism, unspecified type   [facility], Laurey Arrow, MD   1 year ago Hypothyroidism, unspecified type   [facility], Laurey Arrow, MD   1 year ago Annual physical exam   [facility], Laurey Arrow, MD   2 years ago Hypothyroidism, unspecified type   Primary Care at [facility], Laurey Arrow, MD

## 2019-05-25 ENCOUNTER — Encounter: Payer: Self-pay | Admitting: Family Medicine

## 2019-05-26 ENCOUNTER — Other Ambulatory Visit: Payer: Self-pay | Admitting: *Deleted

## 2019-05-26 MED ORDER — PROPRANOLOL HCL 10 MG PO TABS: 10.0000 mg | ORAL_TABLET | Freq: Three times a day (TID) | ORAL | 0 refills | Status: DC

## 2019-05-26 MED FILL — PROPRANOLOL 10 MG TABLET: 10 | 30 days supply | Qty: 90 | Fill #0

## 2019-06-20 ENCOUNTER — Encounter: Payer: Self-pay | Admitting: Family Medicine

## 2019-06-20 MED FILL — SYNTHROID 125 MCG TABLET: 125 | 30 days supply | Qty: 30 | Fill #1

## 2019-06-22 ENCOUNTER — Other Ambulatory Visit: Payer: Self-pay

## 2019-06-22 DIAGNOSIS — R498 Other voice and resonance disorders: Secondary | ICD-10-CM

## 2019-06-25 ENCOUNTER — Other Ambulatory Visit: Payer: Self-pay | Admitting: Family Medicine

## 2019-06-25 ENCOUNTER — Encounter: Payer: Self-pay | Admitting: Family Medicine

## 2019-06-25 NOTE — Telephone Encounter (Signed)
Requested medication (s) are due for refill today: yes  Requested medication (s) are on the active medication list: yes  Last refill:  05/18/2019  Future visit scheduled: no  Notes to clinic:  Refill cannot be delegated    Requested Prescriptions  Pending Prescriptions Disp Refills   diazepam (VALIUM) 2 MG tablet [Pharmacy Med Name: diazePAM 2 MG TABS 2 Tablet] 30 tablet 0    Sig: TAKE ONE-HALF TABLET (1 MG TOTAL) BY MOUTH EVERY 12 (TWELVE) HOURS.     Not Delegated - Psychiatry:  Anxiolytics/Hypnotics Failed - 06/25/2019 10:58 AM      Failed - This refill cannot be delegated      Failed - Urine Drug Screen completed in last 360 days.      Passed - Valid encounter within last 6 months    Recent Outpatient Visits          2 months ago Exposure to Covid-19 Virus   Primary Care at [facility], Arlie Solomons, MD   3 months ago Encounter for health maintenance examination in adult   Primary Care at [facility], [place], [place]   10 months ago Hypothyroidism, unspecified type   [facility], Laurey Arrow, MD   1 year ago Hypothyroidism, unspecified type   [facility], Laurey Arrow, MD   1 year ago Annual physical exam   Primary Care at [facility], Laurey Arrow, MD

## 2019-06-26 MED FILL — diazePAM 2 MG TABS: 2 | 30 days supply | Qty: 30 | Fill #0

## 2019-06-26 NOTE — Telephone Encounter (Signed)
Patient is calling back to check status of this request.

## 2019-06-30 ENCOUNTER — Other Ambulatory Visit: Payer: Self-pay | Admitting: Family Medicine

## 2019-07-01 MED ORDER — PROPRANOLOL HCL 10 MG PO TABS: 10.0000 mg | ORAL_TABLET | Freq: Three times a day (TID) | ORAL | 0 refills | Status: DC

## 2019-07-01 MED FILL — PROPRANOLOL 10 MG TABLET: 10 | 30 days supply | Qty: 90 | Fill #0

## 2019-07-02 MED FILL — FLUARIX QUADRIVALENT 0.5 ML: 0.5 | 1 days supply | Qty: 1 | Fill #0

## 2019-07-07 ENCOUNTER — Other Ambulatory Visit: Payer: Self-pay | Admitting: Family Medicine

## 2019-07-07 NOTE — Telephone Encounter (Signed)
Requested medication (s) are due for refill today: no  Requested medication (s) are on the active medication list: no  Last refill:  04/09/2019  Future visit scheduled: no  Notes to clinic:  Patient has a different dose on medication list Review for refill   Requested Prescriptions  Pending Prescriptions Disp Refills   propranolol ER (INDERAL LA) 60 MG 24 hr capsule [Pharmacy Med Name: PROPRANOLOL HCL ER 60 MG CP 60 Capsule] 90 capsule 0    Sig: Take 1 capsule (60 mg total) by mouth daily.     Cardiovascular:  Beta Blockers Passed - 07/07/2019  7:42 AM      Passed - Last BP in normal range    BP Readings from Last 1 Encounters:  04/21/19 127/82         Passed - Last Heart Rate in normal range    Pulse Readings from Last 1 Encounters:  03/23/19 70         Passed - Valid encounter within last 6 months    Recent Outpatient Visits          2 months ago Exposure to Covid-19 Virus   Primary Care at [facility], Arlie Solomons, MD   3 months ago Encounter for health maintenance examination in adult   Primary Care at [facility], [place], [place]   11 months ago Hypothyroidism, unspecified type   [facility] at [facility], Laurey Arrow, MD   1 year ago Hypothyroidism, unspecified type   [facility] at [facility], Laurey Arrow, MD   1 year ago Annual physical exam   Primary Care at [facility], Laurey Arrow, MD

## 2019-07-10 ENCOUNTER — Encounter: Payer: Self-pay | Admitting: Family Medicine

## 2019-07-10 MED FILL — buPROPion HCL ER (XL) 300 M: 300 | 90 days supply | Qty: 90 | Fill #3

## 2019-07-10 NOTE — Telephone Encounter (Signed)
Spoke with pt. She will have to look at her schedule and call the office back

## 2019-07-10 NOTE — Telephone Encounter (Signed)
Patient has been sent an courtesy refill please schedule an appt before any further refills

## 2019-07-15 ENCOUNTER — Encounter: Payer: Self-pay | Admitting: Family Medicine

## 2019-07-17 ENCOUNTER — Other Ambulatory Visit: Payer: Self-pay | Admitting: Family Medicine

## 2019-07-17 MED ORDER — PROPRANOLOL HCL ER 60 MG PO CP24: 60.0000 mg | ORAL_CAPSULE | Freq: Every day | ORAL | 3 refills | Status: DC

## 2019-07-17 MED ORDER — PROPRANOLOL HCL 10 MG PO TABS: 10.0000 mg | ORAL_TABLET | Freq: Three times a day (TID) | ORAL | 0 refills | Status: DC

## 2019-07-17 MED FILL — PROPRANOLOL HCL ER 60 MG CP: 60 | 90 days supply | Qty: 90 | Fill #0

## 2019-07-27 ENCOUNTER — Ambulatory Visit: Payer: 59 | Admitting: Family Medicine

## 2019-07-27 ENCOUNTER — Encounter: Payer: Self-pay | Admitting: Family Medicine

## 2019-07-27 ENCOUNTER — Other Ambulatory Visit: Payer: Self-pay

## 2019-07-27 VITALS — BP 122/80 | HR 76 | Temp 97.9°F | Resp 12 | Ht 64.5 in | Wt 192.0 lb

## 2019-07-27 DIAGNOSIS — E039 Hypothyroidism, unspecified: Secondary | ICD-10-CM | POA: Diagnosis not present

## 2019-07-27 DIAGNOSIS — R0789 Other chest pain: Secondary | ICD-10-CM

## 2019-07-27 DIAGNOSIS — R498 Other voice and resonance disorders: Secondary | ICD-10-CM | POA: Diagnosis not present

## 2019-07-27 MED ORDER — SYNTHROID 125 MCG PO TABS: 125.0000 ug | ORAL_TABLET | Freq: Every day | ORAL | 1 refills | Status: DC

## 2019-07-27 MED ORDER — DIAZEPAM 2 MG PO TABS: ORAL_TABLET | ORAL | 2 refills | Status: DC

## 2019-07-27 MED ORDER — PROPRANOLOL HCL 10 MG PO TABS: 10.0000 mg | ORAL_TABLET | Freq: Three times a day (TID) | ORAL | 3 refills | Status: DC

## 2019-07-27 MED FILL — diazePAM 2 MG TABS: 2 | 30 days supply | Qty: 30 | Fill #1

## 2019-07-27 MED FILL — SYNTHROID 125 MCG TABLET: 125 | 90 days supply | Qty: 90 | Fill #0

## 2019-07-27 MED FILL — PROPRANOLOL 10 MG TABLET: 10 | 30 days supply | Qty: 90 | Fill #0

## 2019-07-27 NOTE — Patient Instructions (Signed)
° ° ° °  If you have lab work done today you will be contacted with your lab results within the next 2 weeks.  If you have not heard from us then please contact us. The fastest way to get your results is to register for My Chart. ° ° °IF you received an x-ray today, you will receive an invoice from [facility]. Please contact [facility] at [phone] with questions or concerns regarding your invoice.  ° °IF you received labwork today, you will receive an invoice from [facility]. Please contact [facility] at [phone] with questions or concerns regarding your invoice.  ° °Our billing staff will not be able to assist you with questions regarding bills from these companies. ° °You will be contacted with the lab results as soon as they are available. The fastest way to get your results is to activate your My Chart account. Instructions are located on the last page of this paperwork. If you have not heard from us regarding the results in 2 weeks, please contact this office. °  ° ° ° °

## 2019-07-27 NOTE — Progress Notes (Signed)
Established Patient Office Visit  Subjective:  Patient ID: Katherine Garrett, female    DOB: 04/27/1966  Age: 53 y.o. MRN: [id number]  CC:  Chief Complaint  Patient presents with  . Follow-up    medication f/u--pt stated the diazepam  . chest problem    pt stated having twitching feeling statrted this morning.    HPI Katherine Garrett presents for   Vocal tremor Patient reports that she is here for her valium for her vocal tremor She states that when at work when she gets really stressed her voice will disappear    Obesity Body mass index is 32.45 kg/m. 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 Symptoms consist of denies fatigue, weight changes, heat/cold intolerance, bowel/skin changes or CVS symptoms.  The problem has been stable.  Previous thyroid studies include TSH. The hypothyroidism is due to hypothyroidism.  Lab Results  Component Value Date   TSH 3.120 03/23/2019    Past Medical History:  Diagnosis Date  . Anxiety   . Depression   . 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 Hypothyroid dx 1998  . Insomnia 2002/12/21 onset   following death of spouse  . Migraines   . 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 Urine, incontinence, stress female     Past Surgical History:  Procedure Laterality Date  . Child birth  24 & 24   x's 2  . TONSILLECTOMY  1970    Family History  Problem Relation Age of Onset  . Arthritis Mother   . Ovarian cancer Mother 50  . Hyperlipidemia Mother   . Anxiety disorder Mother   . Depression Mother   . Arthritis Father   . Anxiety disorder Father   . Diabetes Father 37       type 2  . Seizures Father   . Cirrhosis Brother        hep  c  . Heart disease Maternal Grandmother   . Heart disease Maternal Grandfather     Social History   Socioeconomic History  . Marital status: Widowed    Spouse name: Not on file  . Number of children: Not on file  . Years of education: Not on file  . Highest education level: Not on file  Occupational History  . Not on file  Social Needs  . Financial resource strain: Not on file  . Food insecurity    Worry: Not on file    Inability: Not on file  . Transportation needs    Medical: Not on file    Non-medical: Not on file  Tobacco Use  . Smoking status: Never Smoker  . Smokeless tobacco: Never Used  Substance and Sexual Activity  . Alcohol use: Yes    Alcohol/week: 3.0 - 4.0 standard drinks    Types: 2 - 3 Glasses of wine, 1 Standard drinks or equivalent per week    Comment: rarely  . Drug use: No  . Sexual activity: Never    Partners: Male    Birth control/protection: OCP  Lifestyle  . Physical activity    Days per week: Not on file  Minutes per session: Not on file  . Stress: Not on file  Relationships  . Social Herbalist on phone: Not on file    Gets together: Not on file    Attends religious service: Not on file    Active member of club or organization: Not on file    Attends meetings of clubs or organizations: Not on file    Relationship status: Not on file  . Intimate partner violence    Fear of current or ex partner: Not on file    Emotionally abused: Not on file    Physically abused: Not on file    Forced sexual activity: Not on file  Other Topics Concern  . Not on file  Social History Narrative   Husband died in 2002/11/27 from stomach cancer.   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 albuterol (VENTOLIN HFA) 108 (90 Base) MCG/ACT inhaler Inhale 2 puffs into the lungs every 6 (six) hours as needed for wheezing or shortness of breath. 18 g  0  . azithromycin (ZITHROMAX) 250 MG tablet Take 2 tablets on day one then one tablet each day after 6 tablet 0  . buPROPion (WELLBUTRIN XL) 300 MG 24 hr tablet Take 1 tablet (300 mg total) by mouth daily. 90 tablet 3  . esomeprazole (NEXIUM) 40 MG capsule Take 40 mg by mouth daily at 12 noon.    Marland Kitchen ketoconazole (NIZORAL) 2 % cream Apply 1 application topically 2 (two) times daily. 30 g 1  . propranolol ER (INDERAL LA) 60 MG 24 hr capsule Take 1 capsule (60 mg total) by mouth daily. 90 capsule 3  . VITAMIN D PO Take by mouth.    . diazepam (VALIUM) 2 MG tablet TAKE ONE-HALF TABLET (1 MG TOTAL) BY MOUTH EVERY 12 (TWELVE) HOURS. 30 tablet 3  . propranolol (INDERAL) 10 MG tablet Take 1 tablet (10 mg total) by mouth 3 (three) times daily. 90 tablet 0  . SYNTHROID 125 MCG tablet TAKE 1 TABLET (125 MCG TOTAL) BY MOUTH DAILY BEFORE BREAKFAST. 30 tablet 1   No facility-administered medications prior to visit.     Allergies  Allergen Reactions  . Codeine Nausea And Vomiting  . Primidone     Bone pain   . Topamax [Topiramate]     Bone pain     ROS Review of Systems Review of Systems  Constitutional: Negative for activity change, appetite change, chills and fever.  HENT: Negative for congestion, nosebleeds, trouble swallowing and voice change.   Respiratory: Negative for cough, shortness of breath and wheezing.   Gastrointestinal: Negative for diarrhea, nausea and vomiting.  Genitourinary: Negative for difficulty urinating, dysuria, flank pain and hematuria.  Musculoskeletal: Negative for back pain, joint swelling and neck pain.  Neurological: Negative for dizziness, speech difficulty, light-headedness and numbness.  See HPI. All other review of systems negative.     Objective:    Physical Exam  BP 122/80   Pulse 76   Temp 97.9 F (36.6 C)   Resp 12   Ht 5' 4.5" (1.638 m)   Wt 192 lb (87.1 kg)   LMP 04/11/2017   SpO2 97%   BMI 32.45 kg/m  Wt Readings from Last 3 Encounters:   07/27/19 192 lb (87.1 kg)  04/21/19 187 lb (84.8 kg)  03/23/19 189 lb 6.4 oz (85.9 kg)   Physical Exam  Constitutional: Oriented to person, place, and time. Appears well-developed and well-nourished.  HENT:  Head: Normocephalic and atraumatic.  Eyes: Conjunctivae and EOM are normal.  Cardiovascular: Normal rate, regular rhythm, normal heart sounds and intact distal pulses.  No murmur heard. Pulmonary/Chest: Effort normal and breath sounds normal. No stridor. No respiratory distress. Has no wheezes.  Neurological: Is alert and oriented to person, place, and time.  Skin: Skin is warm. Capillary refill takes less than 2 seconds.  Psychiatric: Has a normal mood and affect. Behavior is normal. Judgment and thought content normal.    Health Maintenance Due  Topic Date Due  . MAMMOGRAM  08/23/2018  . INFLUENZA VACCINE  04/18/2019  . PAP SMEAR-Modifier  10/01/2019    There are no preventive care reminders to display for this patient.  Lab Results  Component Value Date   TSH 3.120 03/23/2019   Lab Results  Component Value Date   WBC 4.8 03/23/2019   HGB 13.7 03/23/2019   HCT 40.6 03/23/2019   MCV 89 03/23/2019   PLT 221 03/23/2019   Lab Results  Component Value Date   NA 142 03/23/2019   K 4.3 03/23/2019   CO2 24 03/23/2019   GLUCOSE 95 03/23/2019   BUN 14 03/23/2019   CREATININE 0.87 03/23/2019   BILITOT 0.2 03/23/2019   ALKPHOS 89 03/23/2019   AST 27 03/23/2019   ALT 26 03/23/2019   PROT 6.7 03/23/2019   ALBUMIN 4.4 03/23/2019   CALCIUM 9.1 03/23/2019   GFR 65.78 05/12/2015   Lab Results  Component Value Date   CHOL 233 (H) 03/23/2019   Lab Results  Component Value Date   HDL 91 03/23/2019   Lab Results  Component Value Date   LDLCALC 122 (H) 03/23/2019   Lab Results  Component Value Date   TRIG 100 03/23/2019   Lab Results  Component Value Date   CHOLHDL 2.6 03/23/2019   Lab Results  Component Value Date   HGBA1C 5.3 08/24/2016       Assessment & Plan:   Problem List Items Addressed This Visit      Endocrine   Hypothyroid  - thyroid stable   Relevant Medications   SYNTHROID 125 MCG tablet   propranolol (INDERAL) 10 MG tablet     Other   Vocal tremor  - improved with valium   Relevant Medications   diazepam (VALIUM) 2 MG tablet (Start on 08/26/2019)    Other Visit Diagnoses    Other chest pain    -  Primary ecg reassuring  Likely musculoskeletal based on hitory and ecg findings   Relevant Orders   EKG 12-Lead (Completed)      Meds ordered this encounter  Medications  . diazepam (VALIUM) 2 MG tablet    Sig: TAKE ONE-HALF TABLET (1 MG TOTAL) BY MOUTH EVERY 12 (TWELVE) HOURS.    Dispense:  30 tablet    Refill:  2  . SYNTHROID 125 MCG tablet    Sig: Take 1 tablet (125 mcg total) by mouth daily before breakfast.    Dispense:  90 tablet    Refill:  1  . propranolol (INDERAL) 10 MG tablet    Sig: Take 1 tablet (10 mg total) by mouth 3 (three) times daily.    Dispense:  90 tablet    Refill:  3    Follow-up: No follow-ups on file.    Forrest Moron, MD

## 2019-08-21 ENCOUNTER — Other Ambulatory Visit: Payer: Self-pay

## 2019-08-21 DIAGNOSIS — Z20822 Contact with and (suspected) exposure to covid-19: Secondary | ICD-10-CM

## 2019-08-24 LAB — NOVEL CORONAVIRUS, NAA: SARS-CoV-2, NAA: NOT DETECTED

## 2019-08-24 MED FILL — diazePAM 2 MG TABS: 2 | 30 days supply | Qty: 30 | Fill #2

## 2019-09-03 DIAGNOSIS — Z20828 Contact with and (suspected) exposure to other viral communicable diseases: Secondary | ICD-10-CM | POA: Diagnosis not present

## 2019-10-01 MED FILL — diazePAM 2 MG TABS: 2 | 30 days supply | Qty: 30 | Fill #3

## 2019-10-01 MED FILL — PROPRANOLOL 10 MG TABLET: 10 | 30 days supply | Qty: 90 | Fill #1

## 2019-10-07 ENCOUNTER — Encounter: Payer: Self-pay | Admitting: Family Medicine

## 2019-10-27 ENCOUNTER — Telehealth (INDEPENDENT_AMBULATORY_CARE_PROVIDER_SITE_OTHER): Payer: 59 | Admitting: Family Medicine

## 2019-10-27 ENCOUNTER — Other Ambulatory Visit: Payer: Self-pay

## 2019-10-27 DIAGNOSIS — M79674 Pain in right toe(s): Secondary | ICD-10-CM

## 2019-10-27 DIAGNOSIS — R498 Other voice and resonance disorders: Secondary | ICD-10-CM | POA: Diagnosis not present

## 2019-10-27 MED ORDER — BUPROPION HCL ER (XL) 300 MG PO TB24: 300.0000 mg | ORAL_TABLET | Freq: Every day | ORAL | 3 refills | Status: DC

## 2019-10-27 MED ORDER — DIAZEPAM 2 MG PO TABS: ORAL_TABLET | ORAL | 3 refills | Status: DC

## 2019-10-27 MED FILL — PROPRANOLOL HCL ER 60 MG CP: 60 | 90 days supply | Qty: 90 | Fill #1

## 2019-10-27 MED FILL — BUPROPION HCL XL 300 MG TAB: 300 | 30 days supply | Qty: 30 | Fill #0

## 2019-10-27 MED FILL — SYNTHROID 125 MCG TABLET: 125 | 90 days supply | Qty: 90 | Fill #1

## 2019-10-27 NOTE — Patient Instructions (Signed)
° ° ° °  If you have lab work done today you will be contacted with your lab results within the next 2 weeks.  If you have not heard from us then please contact us. The fastest way to get your results is to register for My Chart. ° ° °IF you received an x-ray today, you will receive an invoice from [facility]. Please contact [facility] at [phone] with questions or concerns regarding your invoice.  ° °IF you received labwork today, you will receive an invoice from [facility]. Please contact [facility] at [phone] with questions or concerns regarding your invoice.  ° °Our billing staff will not be able to assist you with questions regarding bills from these companies. ° °You will be contacted with the lab results as soon as they are available. The fastest way to get your results is to activate your My Chart account. Instructions are located on the last page of this paperwork. If you have not heard from us regarding the results in 2 weeks, please contact this office. °  ° ° ° °

## 2019-10-27 NOTE — Progress Notes (Signed)
Telemedicine Encounter- SOAP NOTE Established Patient  This telephone encounter was conducted with the patient's (or proxy's) verbal consent via audio telecommunications: yes/no: Yes Patient was instructed to have this encounter in a suitably private space; and to only have persons present to whom they give permission to participate. In addition, patient identity was confirmed by use of name plus two identifiers (DOB and address).  I discussed the limitations, risks, security and privacy concerns of performing an evaluation and management service by telephone and the availability of in person appointments. I also discussed with the patient that there may be a patient responsible charge related to this service. The patient expressed understanding and agreed to proceed.  I spent a total of TIME; 0 MIN TO 60 MIN: 15 minutes talking with the patient or their proxy.  No chief complaint on file.   Subjective   Katherine Garrett is a 54 y.o. established patient. Telephone visit today for  HPI   Patient Active Problem List   Diagnosis Date Noted  . Vocal tremor 08/05/2018  . Insomnia 08/24/2016  . Obese 05/12/2015  . Hypothyroid 04/15/2011  . Benign head tremor 04/15/2011  . Depression 04/15/2011  . Anxiety 04/15/2011    Past Medical History:  Diagnosis Date  . Anxiety   . Depression   . 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 Hypothyroid dx 1998  . Insomnia 2003/01/05 onset   following death of spouse  . Migraines   . 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 Urine, incontinence, stress female     Current Outpatient Medications  Medication Sig Dispense Refill  . albuterol (VENTOLIN HFA) 108 (90 Base) MCG/ACT inhaler Inhale 2 puffs into the lungs every 6 (six) hours as needed for wheezing or shortness of breath. 18 g 0  . buPROPion (WELLBUTRIN XL) 300 MG 24 hr tablet Take 1 tablet (300 mg total) by mouth daily. 90 tablet 3  . diazepam (VALIUM) 2 MG tablet TAKE ONE-HALF TABLET (1 MG TOTAL) BY MOUTH EVERY  12 (TWELVE) HOURS. 30 tablet 3  . esomeprazole (NEXIUM) 40 MG capsule Take 40 mg by mouth daily at 12 noon.    Marland Kitchen ketoconazole (NIZORAL) 2 % cream Apply 1 application topically 2 (two) times daily. 30 g 1  . propranolol (INDERAL) 10 MG tablet Take 1 tablet (10 mg total) by mouth 3 (three) times daily. 90 tablet 3  . propranolol ER (INDERAL LA) 60 MG 24 hr capsule Take 1 capsule (60 mg total) by mouth daily. 90 capsule 3  . SYNTHROID 125 MCG tablet Take 1 tablet (125 mcg total) by mouth daily before breakfast. 90 tablet 1  . VITAMIN D PO Take by mouth.    Marland Kitchen azithromycin (ZITHROMAX) 250 MG tablet Take 2 tablets on day one then one tablet each day after 6 tablet 0   No current facility-administered medications for this visit.    Allergies  Allergen Reactions  . Codeine Nausea And Vomiting  . Primidone     Bone pain   . Topamax [Topiramate]     Bone pain     Social History   Socioeconomic History  . Marital status: Widowed    Spouse name: Not on file  . Number of children: Not on file  . Years of education: Not on file  . Highest education level: Not on file  Occupational History  . Not on file  Tobacco Use  . Smoking status: Never Smoker  . Smokeless tobacco: Never Used  Substance and  Sexual Activity  . Alcohol use: Yes    Alcohol/week: 3.0 - 4.0 standard drinks    Types: 2 - 3 Glasses of wine, 1 Standard drinks or equivalent per week    Comment: rarely  . Drug use: No  . Sexual activity: Never    Partners: Male    Birth control/protection: OCP  Other Topics Concern  . Not on file  Social History Narrative   Husband died in 12/09/2002 from stomach cancer.   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 Difficulty of Paying Living Expenses: Not on file  Food Insecurity:   . Worried About Charity fundraiser in the Last Year: Not on file  . Ran Out of Food in the Last Year: Not on file   Transportation Needs:   . Lack of Transportation (Medical): Not on file  . Lack of Transportation (Non-Medical): Not on file  Physical Activity:   . Days of Exercise per Week: Not on file  . Minutes of Exercise per Session: Not on file  Stress:   . Feeling of Stress : Not on file  Social Connections:   . Frequency of Communication with Friends and Family: Not on file  . Frequency of Social Gatherings with Friends and Family: Not on file  . Attends Religious Services: Not on file  . Active Member of Clubs or Organizations: Not on file  . Attends Archivist Meetings: Not on file  . Marital Status: Not on file  Intimate Partner Violence:   . Fear of Current or Ex-Partner: Not on file  . Emotionally Abused: Not on file  . Physically Abused: Not on file  . Sexually Abused: Not on file    ROS Review of Systems  Constitutional: Negative for activity change, appetite change, chills and fever.  HENT: Negative for congestion, nosebleeds, trouble swallowing and voice change.   Respiratory: Negative for cough, shortness of breath and wheezing.   Gastrointestinal: Negative for diarrhea, nausea and vomiting.  See HPI. All other review of systems negative.   Objective   Physical exam not done  Vitals as reported by the patient: There were no vitals filed for this visit.  Diagnoses and all orders for this visit:  Pain in right toe(s) -     Ambulatory referral to Podiatry  Vocal tremor -     diazepam (VALIUM) 2 MG tablet; TAKE ONE-HALF TABLET (1 MG TOTAL) BY MOUTH EVERY 12 (TWELVE) HOURS.  Other orders -     buPROPion (WELLBUTRIN XL) 300 MG 24 hr tablet; Take 1 tablet (300 mg total) by mouth daily.  -  Continue current meds -  Stable on her treatment plan   I discussed the assessment and treatment plan with the patient. The patient was provided an opportunity to ask questions and all were answered. The patient agreed with the plan and demonstrated an understanding of  the instructions.   The patient was advised to call back or seek an in-person evaluation if the symptoms worsen or if the condition fails to improve as anticipated.  I provided 15 minutes of non-face-to-face time during this encounter.  Forrest Moron, MD  Primary Care at [facility]

## 2019-10-27 NOTE — Progress Notes (Signed)
CC:  F/u chronic medical conditions.  Pt has concerns about a place on her foot and thinks it could be gout but not sure.  Possibly made need referral to podiatry.  Foot is painful when she walks and pain level is 8/10 and uses advil for pain with little relief.  Pt needs refills on wellbutrin and diazepam.  No recent weight or bp taken.  No travel outside the [place] but pt did travel to [place] for 2 weeks for the passing of her father.

## 2019-10-29 MED FILL — diazePAM 2 MG TABS: 2 | 30 days supply | Qty: 30 | Fill #0

## 2019-11-05 ENCOUNTER — Ambulatory Visit: Payer: 59 | Admitting: Podiatry

## 2019-11-26 MED FILL — diazePAM 2 MG TABS: 2 | 30 days supply | Qty: 30 | Fill #1

## 2019-11-26 MED FILL — PROPRANOLOL 10 MG TABLET: 10 | 30 days supply | Qty: 90 | Fill #2

## 2019-12-02 MED FILL — buPROPion HCL ER (XL) 300 M: 300 | 30 days supply | Qty: 30 | Fill #1

## 2019-12-29 ENCOUNTER — Other Ambulatory Visit: Payer: Self-pay

## 2019-12-29 ENCOUNTER — Ambulatory Visit: Payer: 59 | Admitting: Podiatry

## 2019-12-29 ENCOUNTER — Ambulatory Visit (INDEPENDENT_AMBULATORY_CARE_PROVIDER_SITE_OTHER): Payer: 59

## 2019-12-29 DIAGNOSIS — M79671 Pain in right foot: Secondary | ICD-10-CM

## 2019-12-29 DIAGNOSIS — M2021 Hallux rigidus, right foot: Secondary | ICD-10-CM

## 2019-12-29 DIAGNOSIS — Z01818 Encounter for other preprocedural examination: Secondary | ICD-10-CM

## 2019-12-29 DIAGNOSIS — Z79899 Other long term (current) drug therapy: Secondary | ICD-10-CM

## 2019-12-29 DIAGNOSIS — M79674 Pain in right toe(s): Secondary | ICD-10-CM

## 2019-12-29 DIAGNOSIS — M779 Enthesopathy, unspecified: Secondary | ICD-10-CM

## 2019-12-29 NOTE — Patient Instructions (Signed)
Pre-Operative Instructions  Congratulations, you have decided to take an important step to improving your quality of life.  You can be assured that the doctors of [facility] will be with you every step of the way.  1. Plan to be at the surgery center/hospital at least 1 (one) hour prior to your scheduled time unless otherwise directed by the surgical center/hospital staff.  You must have a responsible adult accompany you, remain during the surgery and drive you home.  Make sure you have directions to the surgical center/hospital and know how to get there on time. 2. For hospital based surgery you will need to obtain a history and physical form from your family physician within 1 month prior to the date of surgery- we will give you a form for you primary physician.  3. We make every effort to accommodate the date you request for surgery.  There are however, times where surgery dates or times have to be moved.  We will contact you as soon as possible if a change in schedule is required.   4. No Aspirin/Ibuprofen for one week before surgery.  If you are on aspirin, any non-steroidal anti-inflammatory medications (Mobic, Aleve, Ibuprofen) you should stop taking it 7 days prior to your surgery.  You make take Tylenol  For pain prior to surgery.  5. Medications- If you are taking daily heart and blood pressure medications, seizure, reflux, allergy, asthma, anxiety, pain or diabetes medications, make sure the surgery center/hospital is aware before the day of surgery so they may notify you which medications to take or avoid the day of surgery. 6. No food or drink after midnight the night before surgery unless directed otherwise by surgical center/hospital staff. 7. No alcoholic beverages 24 hours prior to surgery.  No smoking 24 hours prior to or 24 hours after surgery. 8. Wear loose pants or shorts- loose enough to fit over bandages, boots, and casts. 9. No slip on shoes, sneakers are best. 10. Bring  your boot with you to the surgery center/hospital.  Also bring crutches or a walker if your physician has prescribed it for you.  If you do not have this equipment, it will be provided for you after surgery. 11. If you have not been contracted by the surgery center/hospital by the day before your surgery, call to confirm the date and time of your surgery. 12. Leave-time from work may vary depending on the type of surgery you have.  Appropriate arrangements should be made prior to surgery with your employer. 13. Prescriptions will be provided immediately following surgery by your doctor.  Have these filled as soon as possible after surgery and take the medication as directed. 14. Remove nail polish on the operative foot. 15. Wash the night before surgery.  The night before surgery wash the foot and leg well with the antibacterial soap provided and water paying special attention to beneath the toenails and in between the toes.  Rinse thoroughly with water and dry well with a towel.  Perform this wash unless told not to do so by your physician.  Enclosed: 1 Ice pack (please put in freezer the night before surgery)   1 Hibiclens skin cleaner   Pre-op Instructions  If you have any questions regarding the instructions, do not hesitate to call our office at any point during this process.   [place]: [place], [place] [address] [place]: [address] [place], [place] [address] [phone]  Dr. Celesta Gentile, DPM Terbinafine  oral granules What is this medicine? TERBINAFINE (TER bin a feen) is an antifungal medicine. It is used to treat certain kinds of fungal or yeast infections. This medicine may be used for other purposes; ask your health care provider or pharmacist if you have questions. COMMON BRAND NAME(S): Lamisil What should I tell my health care provider before I take this medicine? They need to know if you have any of these conditions:  drink alcoholic  beverages  kidney disease  liver disease  an unusual or allergic reaction to Terbinafine, other medicines, foods, dyes, or preservatives  pregnant or trying to get pregnant  breast-feeding How should I use this medicine? Take this medicine by mouth. Follow the directions on the prescription label. Hold packet with cut line on top. Shake packet gently to settle contents. Tear packet open along cut line, or use scissors to cut across line. Carefully pour the entire contents of packet onto a spoonful of a soft food, such as pudding or other soft, non-acidic food such as mashed potatoes (do NOT use applesauce or a fruit-based food). If two packets are required for each dose, you may either sprinkle the content of both packets on one spoonful of non-acidic food, or sprinkle the contents of both packets on two spoonfuls of non-acidic food. Make sure that no granules remain in the packet. Swallow the mxiture of the food and granules without chewing. Take your medicine at regular intervals. Do not take it more often than directed. Take all of your medicine as directed even if you think you are better. Do not skip doses or stop your medicine early. Contact your pediatrician or health care professional regarding the use of this medicine in children. While this medicine may be prescribed for children as young as 4 years for selected conditions, precautions do apply. Overdosage: If you think you have taken too much of this medicine contact a poison control center or emergency room at once. NOTE: This medicine is only for you. Do not share this medicine with others. What if I miss a dose? If you miss a dose, take it as soon as you can. If it is almost time for your next dose, take only that dose. Do not take double or extra doses. What may interact with this medicine? Do not take this medicine with any of the following medications:  thioridazine This medicine may also interact with the following  medications:  beta-blockers  caffeine  cimetidine  cyclosporine  MAOIs like Carbex, Eldepryl, Marplan, Nardil, and Parnate  medicines for fungal infections like fluconazole and ketoconazole  medicines for irregular heartbeat like amiodarone, flecainide and propafenone  rifampin  SSRIs like citalopram, escitalopram, fluoxetine, fluvoxamine, paroxetine and sertraline  tricyclic antidepressants like amitriptyline, clomipramine, desipramine, imipramine, nortriptyline, and others  warfarin This list may not describe all possible interactions. Give your health care provider a list of all the medicines, herbs, non-prescription drugs, or dietary supplements you use. Also tell them if you smoke, drink alcohol, or use illegal drugs. Some items may interact with your medicine. What should I watch for while using this medicine? Your doctor may monitor your liver function. Tell your doctor right away if you have nausea or vomiting, loss of appetite, stomach pain on your right upper side, yellow skin, dark urine, light stools, or are over tired. This medicine may cause serious skin reactions. They can happen weeks to months after starting the medicine. Contact your health care provider right away if you notice fevers or flu-like symptoms  with a rash. The rash may be red or purple and then turn into blisters or peeling of the skin. Or, you might notice a red rash with swelling of the face, lips or lymph nodes in your neck or under your arms. You need to take this medicine for 6 weeks or longer to cure the fungal infection. Take your medicine regularly for as long as your doctor or health care provider tells you to. What side effects may I notice from receiving this medicine? Side effects that you should report to your doctor or health care professional as soon as possible:  allergic reactions like skin rash or hives, swelling of the face, lips, or tongue  change in vision  dark urine  fever or  infection  general ill feeling or flu-like symptoms  light-colored stools  loss of appetite, nausea  rash, fever, and swollen lymph nodes  redness, blistering, peeling or loosening of the skin, including inside the mouth  right upper belly pain  unusually weak or tired  yellowing of the eyes or skin Side effects that usually do not require medical attention (report to your doctor or health care professional if they continue or are bothersome):  changes in taste  diarrhea  hair loss  muscle or joint pain  stomach upset This list may not describe all possible side effects. Call your doctor for medical advice about side effects. You may report side effects to FDA at 1-800-FDA-1088. Where should I keep my medicine? Keep out of the reach of children. Store at room temperature between 15 and 30 degrees C (59 and 86 degrees F). Throw away any unused medicine after the expiration date. NOTE: This sheet is a summary. It may not cover all possible information. If you have questions about this medicine, talk to your doctor, pharmacist, or health care provider.  2020 Elsevier/Gold Standard (2018-12-12 15:35:11)

## 2019-12-30 ENCOUNTER — Other Ambulatory Visit: Payer: Self-pay | Admitting: Podiatry

## 2019-12-30 DIAGNOSIS — M79674 Pain in right toe(s): Secondary | ICD-10-CM

## 2019-12-30 LAB — CBC WITH DIFFERENTIAL/PLATELET
Absolute Monocytes: 403 cells/uL (ref 200–950)
Basophils Absolute: 42 cells/uL (ref 0–200)
Basophils Relative: 0.8 %
Eosinophils Absolute: 111 cells/uL (ref 15–500)
Eosinophils Relative: 2.1 %
HCT: 43.3 % (ref 35.0–45.0)
Hemoglobin: 14.4 g/dL (ref 11.7–15.5)
Lymphs Abs: 1691 cells/uL (ref 850–3900)
MCH: 30.1 pg (ref 27.0–33.0)
MCHC: 33.3 g/dL (ref 32.0–36.0)
MCV: 90.6 fL (ref 80.0–100.0)
MPV: 12.3 fL (ref 7.5–12.5)
Monocytes Relative: 7.6 %
Neutro Abs: 3053 cells/uL (ref 1500–7800)
Neutrophils Relative %: 57.6 %
Platelets: 239 10*3/uL (ref 140–400)
RBC: 4.78 10*6/uL (ref 3.80–5.10)
RDW: 13.1 % (ref 11.0–15.0)
Total Lymphocyte: 31.9 %
WBC: 5.3 10*3/uL (ref 3.8–10.8)

## 2019-12-30 LAB — COMPLETE METABOLIC PANEL WITH GFR
AG Ratio: 1.6 (calc) (ref 1.0–2.5)
ALT: 23 U/L (ref 6–29)
AST: 24 U/L (ref 10–35)
Albumin: 4.3 g/dL (ref 3.6–5.1)
Alkaline phosphatase (APISO): 80 U/L (ref 37–153)
BUN: 16 mg/dL (ref 7–25)
CO2: 28 mmol/L (ref 20–32)
Calcium: 9.7 mg/dL (ref 8.6–10.4)
Chloride: 104 mmol/L (ref 98–110)
Creat: 0.99 mg/dL (ref 0.50–1.05)
GFR, Est African American: 74 mL/min/{1.73_m2} (ref >=60)
GFR, Est Non African American: 64 mL/min/{1.73_m2} (ref >=60)
Globulin: 2.7 g/dL (calc) (ref 1.9–3.7)
Glucose, Bld: 90 mg/dL (ref 65–139)
Potassium: 4.1 mmol/L (ref 3.5–5.3)
Sodium: 140 mmol/L (ref 135–146)
Total Bilirubin: 0.5 mg/dL (ref 0.2–1.2)
Total Protein: 7 g/dL (ref 6.1–8.1)

## 2019-12-30 LAB — VITAMIN D 25 HYDROXY (VIT D DEFICIENCY, FRACTURES): Vit D, 25-Hydroxy: 25 ng/mL — ABNORMAL LOW (ref 30–100)

## 2019-12-30 NOTE — Progress Notes (Signed)
Subjective:   Patient ID: Katherine Garrett, female   DOB: 54 y.o.   MRN: [id number]   HPI 54 year old female presents the office today for concerns of pain to her right big toe joint.  She gets stiffness and swelling as well as redness to the area.  This is getting worse over the last 6 months.  She cannot do daily activities because of discomfort.  She tried changing shoes but insignificant improvement.  No recent injury to the foot.  She has secondary concerns of toenail fungus.  This is on the past 8 years and no recent treatment.  No pain to the nails.   Review of Systems  All other systems reviewed and are negative.  Past Medical History:  Diagnosis Date  . Anxiety   . Depression   . 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 Hypothyroid dx 1998  . Insomnia December 17, 2002 onset   following death of spouse  . Migraines   . 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 Urine, incontinence, stress female     Past Surgical History:  Procedure Laterality Date  . Child birth  52 & 37   x's 2  . TONSILLECTOMY  1970     Current Outpatient Medications:  .  albuterol (VENTOLIN HFA) 108 (90 Base) MCG/ACT inhaler, Inhale 2 puffs into the lungs every 6 (six) hours as needed for wheezing or shortness of breath., Disp: 18 g, Rfl: 0 .  buPROPion (WELLBUTRIN XL) 300 MG 24 hr tablet, Take 1 tablet (300 mg total) by mouth daily., Disp: 90 tablet, Rfl: 3 .  diazepam (VALIUM) 2 MG tablet, TAKE ONE-HALF TABLET (1 MG TOTAL) BY MOUTH EVERY 12 (TWELVE) HOURS., Disp: 30 tablet, Rfl: 3 .  esomeprazole (NEXIUM) 40 MG capsule, Take 40 mg by mouth daily at 12 noon., Disp: , Rfl:  .  ketoconazole (NIZORAL) 2 % cream, Apply 1 application topically 2 (two) times daily., Disp: 30 g, Rfl: 1 .  propranolol (INDERAL) 10 MG tablet, Take 1 tablet (10 mg total) by mouth 3 (three) times daily., Disp: 90 tablet, Rfl: 3 .  propranolol ER (INDERAL LA) 60 MG 24 hr capsule, Take 1 capsule (60 mg total) by mouth daily., Disp: 90 capsule, Rfl: 3 .  SYNTHROID  125 MCG tablet, Take 1 tablet (125 mcg total) by mouth daily before breakfast., Disp: 90 tablet, Rfl: 1 .  VITAMIN D PO, Take by mouth., Disp: , Rfl:  .  azithromycin (ZITHROMAX) 250 MG tablet, Take 2 tablets on day one then one tablet each day after, Disp: 6 tablet, Rfl: 0  Allergies  Allergen Reactions  . Codeine Nausea And Vomiting  . Primidone     Bone pain   . Topamax [Topiramate]     Bone pain         Objective:  Physical Exam  General: AAO x3, NAD  Dermatological: Nails are hypertrophic, dystrophic with yellow-brown discoloration.  No pain in the nails and there is no redness or drainage or signs of infection.  Vascular: Dorsalis Pedis artery and Posterior Tibial artery pedal pulses are 2/4 bilateral with immedate capillary fill time.  There is no pain with calf compression, swelling, warmth, erythema.   Neruologic: Grossly intact via light touch bilateral.  Sensation intact with Semmes Weinstein monofilament.  Musculoskeletal: There is decreased range of motion of the right first MPJ with pain with range of motion tenderness of the first MPJ.  There is crepitation with MPJ range of motion of the right side.  Dorsal spurring is present.  No other areas of discomfort muscular strength 5/5 in all groups tested bilateral.  Gait: Unassisted, Nonantalgic.       Assessment:   Right hallux rigidus; onychomycosis    Plan:  -Treatment options discussed including all alternatives, risks, and complications -Etiology of symptoms were discussed  1.  Hallux rigidus right side -X-rays were obtained and reviewed.  Arthritic changes present the first MPJ.  There is no evidence of acute fracture. -We discussed with conservative as well as surgical treatment options.  After long discussion she is attempted conservative care including shoe modifications and padding without any significant improvement.  She is looking for surgical intervention.  After discussion of options decided to  proceed with first MPJ arthrodesis. -The incision placement as well as the postoperative course was discussed with the patient. I discussed risks of the surgery which include, but not limited to, infection, bleeding, pain, swelling, need for further surgery, delayed or nonhealing, painful or ugly scar, numbness or sensation changes, over/under correction, recurrence, transfer lesions, further deformity, hardware failure, DVT/PE, loss of toe/foot. Patient understands these risks and wishes to proceed with surgery. The surgical consent was reviewed with the patient all 3 pages were signed. No promises or guarantees were given to the outcome of the procedure. All questions were answered to the best of my ability. Before the surgery the patient was encouraged to call the office if there is any further questions. The surgery will be performed at the [facility] on an outpatient basis. -Blood work ordered  2.  Onychomycosis -Elected proceed with Lamisil peer discussed side effects.  Will check CBC and LFT.  Trula Slade DPM

## 2019-12-31 MED FILL — buPROPion HCL ER (XL) 300 M: 300 | 30 days supply | Qty: 30 | Fill #2

## 2020-01-01 ENCOUNTER — Other Ambulatory Visit: Payer: Self-pay | Admitting: Podiatry

## 2020-01-01 ENCOUNTER — Telehealth: Payer: Self-pay | Admitting: *Deleted

## 2020-01-01 MED ORDER — TERBINAFINE HCL 250 MG PO TABS: 250.0000 mg | ORAL_TABLET | Freq: Every day | ORAL | 0 refills | Status: DC

## 2020-01-01 MED ORDER — VITAMIN D (ERGOCALCIFEROL) 1.25 MG (50000 UNIT) PO CAPS: 50000.0000 [IU] | ORAL_CAPSULE | ORAL | 0 refills | Status: DC

## 2020-01-01 MED FILL — VIT D2 1.25 MG (50,000 UNIT: 1.25 MG | 35 days supply | Qty: 5 | Fill #0

## 2020-01-01 MED FILL — TERBINAFINE HCL 250 MG TAB: 250 | 90 days supply | Qty: 90 | Fill #0

## 2020-01-01 NOTE — Telephone Encounter (Signed)
-----   Message from Trula Slade, DPM sent at 01/01/2020  2:57 PM EDT ----- Val- please let her know that I have sent lamisil to the pharmacy as those labs were normal. Vitamin D level was low so I sent this to the pharmacy as well.

## 2020-01-01 NOTE — Telephone Encounter (Signed)
Left message for pt to call for results  

## 2020-01-04 NOTE — Telephone Encounter (Signed)
I informed pt of Dr. Leigh Aurora review of results and orders. Pt asked if this would hurt her liver and I told her Dr. Jacqualyn Posey schedule pt's for 6 weeks to discuss improvement and repeat labs. Pt asked if toe fusions did okay walking on hard hospital floors and if the joint replacement needed replacing. I told pt she could discuss with Dr. Jacqualyn Posey at her follow up appt. Pt agreed.

## 2020-01-05 ENCOUNTER — Telehealth: Payer: Self-pay | Admitting: *Deleted

## 2020-01-05 MED FILL — diazePAM 2 MG TABS: 2 | 30 days supply | Qty: 30 | Fill #2

## 2020-01-05 NOTE — Telephone Encounter (Signed)
Called patient and left a message-I was calling to see if the patient got a air fracture walker boot the day the patient was here due to patient is having surgery and to call the [place] office at [phone]. Lattie Haw

## 2020-01-14 MED FILL — PROPRANOLOL 10 MG TABLET: 10 | 30 days supply | Qty: 90 | Fill #3

## 2020-01-26 ENCOUNTER — Encounter: Payer: Self-pay | Admitting: Family Medicine

## 2020-01-26 ENCOUNTER — Other Ambulatory Visit: Payer: Self-pay | Admitting: Family Medicine

## 2020-01-26 DIAGNOSIS — R498 Other voice and resonance disorders: Secondary | ICD-10-CM

## 2020-01-26 DIAGNOSIS — E039 Hypothyroidism, unspecified: Secondary | ICD-10-CM

## 2020-01-26 MED ORDER — DIAZEPAM 2 MG PO TABS: ORAL_TABLET | ORAL | 1 refills | Status: DC

## 2020-01-26 MED ORDER — SYNTHROID 125 MCG PO TABS: ORAL_TABLET | ORAL | 0 refills | Status: DC

## 2020-01-26 NOTE — Telephone Encounter (Signed)
Patient is requesting a refill of the following medications: Requested Prescriptions   Pending Prescriptions Disp Refills  . SYNTHROID 125 MCG tablet 90 tablet 0  . diazepam (VALIUM) 2 MG tablet 30 tablet 3    Sig: TAKE ONE-HALF TABLET (1 MG TOTAL) BY MOUTH EVERY 12 (TWELVE) HOURS.   Date of patient request: 01/26/2020 Last office visit: 07/27/2019 Date of last refill: 10/27/2019 Last refill amount: 30

## 2020-02-05 MED FILL — diazePAM 2 MG TABS: 2 | 30 days supply | Qty: 30 | Fill #0

## 2020-03-10 MED FILL — PROPRANOLOL 10 MG TABLET: 10 | 30 days supply | Qty: 90 | Fill #0

## 2020-03-10 MED FILL — diazePAM 2 MG TABS: 2 | 30 days supply | Qty: 30 | Fill #1

## 2020-04-14 MED FILL — diazePAM 2 MG TABS: 2 | 30 days supply | Qty: 30 | Fill #2

## 2020-05-05 MED FILL — PROPRANOLOL HCL ER 60 MG CP: 60 | 90 days supply | Qty: 90 | Fill #3

## 2020-05-19 MED FILL — SYNTHROID 125 MCG TABLET: 125 | 90 days supply | Qty: 90 | Fill #0

## 2020-05-19 MED FILL — diazePAM 2 MG TABS: 2 | 30 days supply | Qty: 30 | Fill #0

## 2020-05-20 ENCOUNTER — Other Ambulatory Visit: Payer: Self-pay

## 2020-05-20 ENCOUNTER — Telehealth: Payer: Self-pay

## 2020-05-20 DIAGNOSIS — E039 Hypothyroidism, unspecified: Secondary | ICD-10-CM

## 2020-05-20 DIAGNOSIS — R498 Other voice and resonance disorders: Secondary | ICD-10-CM

## 2020-05-20 MED ORDER — PROPRANOLOL HCL 10 MG PO TABS: 10.0000 mg | ORAL_TABLET | Freq: Three times a day (TID) | ORAL | 3 refills | Status: DC

## 2020-05-20 MED ORDER — DIAZEPAM 2 MG PO TABS: ORAL_TABLET | ORAL | 1 refills | Status: DC

## 2020-05-20 MED ORDER — SYNTHROID 125 MCG PO TABS: ORAL_TABLET | ORAL | 0 refills | Status: DC

## 2020-05-20 NOTE — Telephone Encounter (Signed)
Sent!

## 2020-05-20 NOTE — Telephone Encounter (Signed)
Pt. Called and made transfer of care visit with [facility]. Pt. Requested medications be refilled until appt.

## 2020-05-21 MED FILL — PROPRANOLOL 10 MG TABLET: 10 | 30 days supply | Qty: 90 | Fill #0

## 2020-05-24 NOTE — Telephone Encounter (Signed)
Sent on the third - Will refill as needed until then  Thanks,  Kathrin Ruddy, NP

## 2020-06-08 MED FILL — buPROPion HCL ER (XL) 300 M: 300 | 90 days supply | Qty: 90 | Fill #4

## 2020-06-29 MED FILL — diazePAM 2 MG TABS: 2 | 30 days supply | Qty: 30 | Fill #1

## 2020-07-14 DIAGNOSIS — Z23 Encounter for immunization: Secondary | ICD-10-CM | POA: Diagnosis not present

## 2020-07-20 ENCOUNTER — Encounter: Payer: 59 | Admitting: Registered Nurse

## 2020-07-22 MED FILL — PROPRANOLOL 10 MG TABLET: 10 | 30 days supply | Qty: 90 | Fill #1

## 2020-08-01 ENCOUNTER — Other Ambulatory Visit: Payer: Self-pay | Admitting: Family

## 2020-08-01 ENCOUNTER — Encounter: Payer: Self-pay | Admitting: Family

## 2020-08-01 ENCOUNTER — Other Ambulatory Visit: Payer: Self-pay

## 2020-08-01 ENCOUNTER — Ambulatory Visit: Payer: 59 | Admitting: Family

## 2020-08-01 VITALS — BP 130/72 | HR 62 | Temp 97.9°F | Ht 64.5 in | Wt 189.0 lb

## 2020-08-01 DIAGNOSIS — Z13220 Encounter for screening for lipoid disorders: Secondary | ICD-10-CM

## 2020-08-01 DIAGNOSIS — Z0000 Encounter for general adult medical examination without abnormal findings: Secondary | ICD-10-CM

## 2020-08-01 DIAGNOSIS — Z8041 Family history of malignant neoplasm of ovary: Secondary | ICD-10-CM | POA: Diagnosis not present

## 2020-08-01 DIAGNOSIS — Z1231 Encounter for screening mammogram for malignant neoplasm of breast: Secondary | ICD-10-CM

## 2020-08-01 DIAGNOSIS — E034 Atrophy of thyroid (acquired): Secondary | ICD-10-CM | POA: Diagnosis not present

## 2020-08-01 DIAGNOSIS — F419 Anxiety disorder, unspecified: Secondary | ICD-10-CM

## 2020-08-01 DIAGNOSIS — G250 Essential tremor: Secondary | ICD-10-CM | POA: Diagnosis not present

## 2020-08-01 DIAGNOSIS — R498 Other voice and resonance disorders: Secondary | ICD-10-CM | POA: Diagnosis not present

## 2020-08-01 LAB — CBC WITH DIFFERENTIAL/PLATELET
Basophils Absolute: 0.1 10*3/uL (ref 0.0–0.1)
Basophils Relative: 1.2 % (ref 0.0–3.0)
Eosinophils Absolute: 0.1 10*3/uL (ref 0.0–0.7)
Eosinophils Relative: 2.8 % (ref 0.0–5.0)
HCT: 42.4 % (ref 36.0–46.0)
Hemoglobin: 14.2 g/dL (ref 12.0–15.0)
Lymphocytes Relative: 33 % (ref 12.0–46.0)
Lymphs Abs: 1.7 10*3/uL (ref 0.7–4.0)
MCHC: 33.4 g/dL (ref 30.0–36.0)
MCV: 88.3 fl (ref 78.0–100.0)
Monocytes Absolute: 0.4 10*3/uL (ref 0.1–1.0)
Monocytes Relative: 8.2 % (ref 3.0–12.0)
Neutro Abs: 2.9 10*3/uL (ref 1.4–7.7)
Neutrophils Relative %: 54.8 % (ref 43.0–77.0)
Platelets: 230 10*3/uL (ref 150.0–400.0)
RBC: 4.81 Mil/uL (ref 3.87–5.11)
RDW: 13.8 % (ref 11.5–15.5)
WBC: 5.2 10*3/uL (ref 4.0–10.5)

## 2020-08-01 LAB — LIPID PANEL
Cholesterol: 212 mg/dL — ABNORMAL HIGH (ref 0–200)
HDL: 81.5 mg/dL (ref >39.00)
LDL Cholesterol: 113 mg/dL — ABNORMAL HIGH (ref 0–99)
NonHDL: 130.7
Total CHOL/HDL Ratio: 3
Triglycerides: 91 mg/dL (ref 0.0–149.0)
VLDL: 18.2 mg/dL (ref 0.0–40.0)

## 2020-08-01 LAB — COMPREHENSIVE METABOLIC PANEL
ALT: 18 U/L (ref 0–35)
AST: 20 U/L (ref 0–37)
Albumin: 4.4 g/dL (ref 3.5–5.2)
Alkaline Phosphatase: 77 U/L (ref 39–117)
BUN: 12 mg/dL (ref 6–23)
CO2: 31 mEq/L (ref 19–32)
Calcium: 9.9 mg/dL (ref 8.4–10.5)
Chloride: 104 mEq/L (ref 96–112)
Creatinine, Ser: 0.86 mg/dL (ref 0.40–1.20)
GFR: 75.34 mL/min (ref >60.00)
Glucose, Bld: 89 mg/dL (ref 70–99)
Potassium: 3.8 mEq/L (ref 3.5–5.1)
Sodium: 142 mEq/L (ref 135–145)
Total Bilirubin: 0.5 mg/dL (ref 0.2–1.2)
Total Protein: 7.4 g/dL (ref 6.0–8.3)

## 2020-08-01 LAB — TSH: TSH: 1.88 u[IU]/mL (ref 0.35–4.50)

## 2020-08-01 MED ORDER — CITALOPRAM HYDROBROMIDE 20 MG PO TABS
20.0000 mg | ORAL_TABLET | Freq: Every day | ORAL | 1 refills | Status: DC
Start: 2020-08-01 — End: 2020-08-01

## 2020-08-01 MED FILL — CITALOPRAM HBR 20 MG TABLET: 20 | 90 days supply | Qty: 90 | Fill #0

## 2020-08-01 NOTE — Progress Notes (Signed)
Katherine Garrett is a 54 y.o. female with the following history as recorded in EpicCare:  Patient Active Problem List   Diagnosis Date Noted  . Vocal tremor 08/05/2018  . Insomnia 08/24/2016  . Obese 05/12/2015  . Hypothyroid 04/15/2011  . Benign head tremor 04/15/2011  . Anxiety 04/15/2011    Current Outpatient Medications  Medication Sig Dispense Refill  . albuterol (VENTOLIN HFA) 108 (90 Base) MCG/ACT inhaler Inhale 2 puffs into the lungs every 6 (six) hours as needed for wheezing or shortness of breath. 18 g 0  . buPROPion (WELLBUTRIN XL) 300 MG 24 hr tablet Take 1 tablet (300 mg total) by mouth daily. 90 tablet 3  . diazepam (VALIUM) 2 MG tablet TAKE ONE-HALF TABLET (1 MG TOTAL) BY MOUTH EVERY 12 (TWELVE) HOURS. 30 tablet 1  . esomeprazole (NEXIUM) 40 MG capsule Take 40 mg by mouth daily at 12 noon.    Marland Kitchen ketoconazole (NIZORAL) 2 % cream Apply 1 application topically 2 (two) times daily. 30 g 1  . propranolol (INDERAL) 10 MG tablet Take 1 tablet (10 mg total) by mouth 3 (three) times daily. 90 tablet 3  . propranolol ER (INDERAL LA) 60 MG 24 hr capsule Take 1 capsule (60 mg total) by mouth daily. 90 capsule 3  . SYNTHROID 125 MCG tablet TAKE 1 TABLET BY MOUTH DAILY BEFORE BREAKFAST 90 tablet 0  . VITAMIN D PO Take by mouth.    . citalopram (CELEXA) 20 MG tablet Take 1 tablet (20 mg total) by mouth daily. 90 tablet 1   No current facility-administered medications for this visit.    Allergies: Codeine, Primidone, and Topamax [topiramate]  Past Medical History:  Diagnosis Date  . Anxiety   . Depression   . 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 Hypothyroid dx 1998  . Insomnia 12-07-2002 onset   following death of spouse  . Migraines   . 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 Urine, incontinence, stress female     Past Surgical History:  Procedure Laterality Date  . Child birth  46 & 19   x's 2  . TONSILLECTOMY  1970    Family History  Problem Relation Age of Onset  . Arthritis Mother   .  Ovarian cancer Mother 83  . Hyperlipidemia Mother   . Anxiety disorder Mother   . Depression Mother   . Arthritis Father   . Anxiety disorder Father   . Diabetes Father 48       type 2  . Seizures Father   . Cirrhosis Brother        hep c  . Heart disease Maternal Grandmother   . Heart disease Maternal Grandfather     Social History   Tobacco Use  . Smoking status: Never Smoker  . Smokeless tobacco: Never Used  Substance Use Topics  . Alcohol use: Yes    Alcohol/week: 3.0 - 4.0 standard drinks    Types: 2 - 3 Glasses of wine, 1 Standard drinks or equivalent per week    Comment: rarely    Subjective:  Presents today as new patient; needs to get yearly CPE updated; overdue for pap smear and mammogram; Has done well on Celexa in the past and would like to consider re-starting due to anxiety related to work stress; denies any concerns for depression and would like that removed from her chart;  Has history of  Migraine headaches and benign head tremor- does well on combination of Inderal and Valium;   Review  of Systems  Constitutional: Negative.   HENT: Negative.   Eyes: Negative.   Respiratory: Negative.   Cardiovascular: Negative.   Gastrointestinal: Negative.   Genitourinary: Negative.   Musculoskeletal: Negative.   Skin: Negative.   Neurological: Positive for tremors, speech change and headaches.  Endo/Heme/Allergies: Negative.   Psychiatric/Behavioral: Negative.      Objective:  Vitals:   08/01/20 1048  BP: 130/72  Pulse: 62  Temp: 97.9 F (36.6 C)  TempSrc: Oral  SpO2: 96%  Weight: 189 lb (85.7 kg)  Height: 5' 4.5" (1.638 m)    General: Well developed, well nourished, in no acute distress  Skin : Warm and dry.  Head: Normocephalic and atraumatic  Eyes: Sclera and conjunctiva clear; pupils round and reactive to light; extraocular movements intact  Ears: External normal; canals clear; tympanic membranes normal  Oropharynx: Pink, supple. No suspicious  lesions  Neck: Supple without thyromegaly, adenopathy  Lungs: Respirations unlabored; clear to auscultation bilaterally without wheeze, rales, rhonchi  CVS exam: normal rate and regular rhythm.  Abdomen: Soft; nontender; nondistended; normoactive bowel sounds; no masses or hepatosplenomegaly  Musculoskeletal: No deformities; no active joint inflammation  Extremities: No edema, cyanosis, clubbing  Vessels: Symmetric bilaterally  Neurologic: Alert and oriented; speech intact; face symmetrical; moves all extremities well; CNII-XII intact without focal deficit  Assessment:  1. PE (physical exam), annual   2. Screening mammogram for breast cancer   3. Lipid screening   4. Hypothyroidism due to acquired atrophy of thyroid   5. FH: ovarian cancer   6. Benign head tremor   7. Vocal tremor   8. Anxiety     Plan:  Age appropriate preventive healthcare needs addressed; encouraged regular eye doctor and dental exams; encouraged regular exercise; will update labs and refills as needed today; follow-up to be determined; Will start patient back on Celexa- she will return in 1 month for medication follow-up and will discussing stopping Wellbutrin at that time; Okay to increase Valium to 2 mg bid prn- she is able to use 1 mg on days she doesn't work;  Sales executive updated for mammogram; check CA-125 due to history of ovarian cancer; plan for pap smear at next OV;  This visit occurred during the SARS-CoV-2 public health emergency.  Safety protocols were in place, including screening questions prior to the visit, additional usage of staff PPE, and extensive cleaning of exam room while observing appropriate contact time as indicated for disinfecting solutions.     Return in about 1 month (around 08/31/2020) for for CPE.  Orders Placed This Encounter  Procedures  . MM Digital Screening    Standing Status:   Future    Standing Expiration Date:   08/01/2021    Order Specific Question:   Reason for Exam (SYMPTOM   OR DIAGNOSIS REQUIRED)    Answer:   screening mammogram    Order Specific Question:   Is the patient pregnant?    Answer:   No    Order Specific Question:   Preferred imaging location?    Answer:   [facility]  . CBC with Differential/Platelet    Standing Status:   Future    Number of Occurrences:   1    Standing Expiration Date:   08/01/2021  . Comp Met (CMET)    Standing Status:   Future    Number of Occurrences:   1    Standing Expiration Date:   08/01/2021  . Lipid panel    Standing Status:   Future  Number of Occurrences:   1    Standing Expiration Date:   08/01/2021  . TSH    Standing Status:   Future    Number of Occurrences:   1    Standing Expiration Date:   08/01/2021  . CA 125    Standing Status:   Future    Number of Occurrences:   1    Standing Expiration Date:   08/01/2021    Requested Prescriptions   Signed Prescriptions Disp Refills  . citalopram (CELEXA) 20 MG tablet 90 tablet 1    Sig: Take 1 tablet (20 mg total) by mouth daily.

## 2020-08-02 ENCOUNTER — Other Ambulatory Visit: Payer: Self-pay | Admitting: Family

## 2020-08-02 DIAGNOSIS — E039 Hypothyroidism, unspecified: Secondary | ICD-10-CM

## 2020-08-02 DIAGNOSIS — R498 Other voice and resonance disorders: Secondary | ICD-10-CM

## 2020-08-02 LAB — CA 125: CA 125: 10 U/mL (ref <35)

## 2020-08-02 MED ORDER — SYNTHROID 125 MCG PO TABS: ORAL_TABLET | ORAL | 3 refills | Status: DC

## 2020-08-02 MED ORDER — DIAZEPAM 2 MG PO TABS: 2.0000 mg | ORAL_TABLET | Freq: Two times a day (BID) | ORAL | 1 refills | Status: DC | PRN

## 2020-08-02 MED FILL — diazePAM 2 MG TABS: 2 | 30 days supply | Qty: 60 | Fill #0

## 2020-08-04 ENCOUNTER — Other Ambulatory Visit: Payer: Self-pay | Admitting: Family

## 2020-08-04 MED FILL — PROPRANOLOL HCL ER 60 MG CP: 60 | 90 days supply | Qty: 90 | Fill #0

## 2020-08-25 MED FILL — SYNTHROID 125 MCG TABLET: 125 | 90 days supply | Qty: 90 | Fill #0

## 2020-08-31 ENCOUNTER — Other Ambulatory Visit: Payer: Self-pay

## 2020-08-31 ENCOUNTER — Ambulatory Visit (INDEPENDENT_AMBULATORY_CARE_PROVIDER_SITE_OTHER): Payer: 59 | Admitting: Family

## 2020-08-31 ENCOUNTER — Other Ambulatory Visit: Payer: Self-pay | Admitting: Family

## 2020-08-31 ENCOUNTER — Encounter: Payer: Self-pay | Admitting: Family

## 2020-08-31 ENCOUNTER — Other Ambulatory Visit (HOSPITAL_COMMUNITY)
Admission: RE | Admit: 2020-08-31 | Discharge: 2020-08-31 | Disposition: A | Discharge status: Home / Self Care | Payer: 59 | Source: Ambulatory Visit | Attending: Family | Admitting: Family

## 2020-08-31 VITALS — BP 120/70 | HR 59 | Temp 98.1°F | Ht 64.5 in | Wt 191.0 lb

## 2020-08-31 DIAGNOSIS — Z124 Encounter for screening for malignant neoplasm of cervix: Secondary | ICD-10-CM

## 2020-08-31 DIAGNOSIS — L720 Epidermal cyst: Secondary | ICD-10-CM

## 2020-08-31 DIAGNOSIS — F419 Anxiety disorder, unspecified: Secondary | ICD-10-CM

## 2020-08-31 DIAGNOSIS — K219 Gastro-esophageal reflux disease without esophagitis: Secondary | ICD-10-CM | POA: Insufficient documentation

## 2020-08-31 MED ORDER — ESOMEPRAZOLE MAGNESIUM 40 MG PO CPDR: 40.0000 mg | DELAYED_RELEASE_CAPSULE | Freq: Every day | ORAL | 3 refills | Status: DC

## 2020-08-31 MED ORDER — ALBUTEROL SULFATE HFA 108 (90 BASE) MCG/ACT IN AERS: 2.0000 | INHALATION_SPRAY | Freq: Four times a day (QID) | RESPIRATORY_TRACT | 1 refills | Status: DC | PRN

## 2020-08-31 MED FILL — ALBUTEROL SULFATE HFA 108 (: 108 (90 BAS | 25 days supply | Qty: 18 | Fill #0

## 2020-08-31 MED FILL — ESOMEPRAZOLE MAG DR 40 MG C: 40 | 90 days supply | Qty: 90 | Fill #0

## 2020-08-31 NOTE — Addendum Note (Signed)
Addended by: Boris Lown B on: 08/31/2020 10:50 AM   Modules accepted: Orders

## 2020-08-31 NOTE — Progress Notes (Signed)
Katherine Garrett is a 54 y.o. female with the following history as recorded in EpicCare:  Patient Active Problem List   Diagnosis Date Noted  . Gastroesophageal reflux disease 08/31/2020  . Vocal tremor 08/05/2018  . Insomnia 08/24/2016  . Obese 05/12/2015  . Hypothyroid 04/15/2011  . Benign head tremor 04/15/2011  . Anxiety 04/15/2011    Current Outpatient Medications  Medication Sig Dispense Refill  . citalopram (CELEXA) 20 MG tablet Take 1 tablet (20 mg total) by mouth daily. 90 tablet 1  . diazepam (VALIUM) 2 MG tablet Take 1 tablet (2 mg total) by mouth every 12 (twelve) hours as needed for anxiety. 60 tablet 1  . ketoconazole (NIZORAL) 2 % cream Apply 1 application topically 2 (two) times daily. 30 g 1  . propranolol (INDERAL) 10 MG tablet Take 1 tablet (10 mg total) by mouth 3 (three) times daily. 90 tablet 3  . propranolol ER (INDERAL LA) 60 MG 24 hr capsule TAKE 1 CAPSULE BY MOUTH DAILY. 90 capsule 3  . SYNTHROID 125 MCG tablet TAKE 1 TABLET BY MOUTH DAILY BEFORE BREAKFAST 90 tablet 3  . VITAMIN D PO Take by mouth.    Marland Kitchen albuterol (VENTOLIN HFA) 108 (90 Base) MCG/ACT inhaler Inhale 2 puffs into the lungs every 6 (six) hours as needed for wheezing or shortness of breath. 6.7 g 1  . esomeprazole (NEXIUM) 40 MG capsule Take 1 capsule (40 mg total) by mouth daily at 12 noon. 90 capsule 3   No current facility-administered medications for this visit.    Allergies: Codeine, Primidone, and Topamax [topiramate]  Past Medical History:  Diagnosis Date  . Anxiety   . Depression   . 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 Hypothyroid dx 1998  . Insomnia 2002-12-26 onset   following death of spouse  . Migraines   . 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 Urine, incontinence, stress female     Past Surgical History:  Procedure Laterality Date  . Child birth  33 & 53   x's 2  . TONSILLECTOMY  1970    Family History  Problem Relation Age of Onset  . Arthritis Mother   . Ovarian cancer Mother 78  .  Hyperlipidemia Mother   . Anxiety disorder Mother   . Depression Mother   . Arthritis Father   . Anxiety disorder Father   . Diabetes Father 55       type 2  . Seizures Father   . Cirrhosis Brother        hep c  . Heart disease Maternal Grandmother   . Heart disease Maternal Grandfather     Social History   Tobacco Use  . Smoking status: Never Smoker  . Smokeless tobacco: Never Used  Substance Use Topics  . Alcohol use: Yes    Alcohol/week: 3.0 - 4.0 standard drinks    Types: 2 - 3 Glasses of wine, 1 Standard drinks or equivalent per week    Comment: rarely    Subjective:  Presents to complete CPE- needs pap smear today; has not been contacted to schedule her mammogram; Doing well on Celexa- has already been able to taper off Wellbutrin; Did review her labs that were done at last OV and had no concerns;    Objective:  Vitals:   08/31/20 0938  BP: 120/70  Pulse: (!) 59  Temp: 98.1 F (36.7 C)  TempSrc: Oral  SpO2: 95%  Weight: 191 lb (86.6 kg)  Height: 5' 4.5" (1.638 m)  General: Well developed, well nourished, in no acute distress  Head: Normocephalic and atraumatic  Lungs: Respirations unlabored; clear to auscultation bilaterally without wheeze, rales, rhonchi  Neurologic: Alert and oriented; speech intact; face symmetrical; moves all extremities well; CNII-XII intact without focal deficit  Pelvic exam: normal external genitalia, vulva, vagina, cervix, uterus and adnexa.  Assessment:  1. Cervical cancer screening   2. Epidermal cyst of face   3. Gastroesophageal reflux disease, unspecified whether esophagitis present   4. Anxiety     Plan:  1. Thin Prep pap collected; plan to re-check in 3 years; 2. ? Parotid gland cyst- refer to ENT for possible drainage; 3. Stable; refill on Nexium; 4. Stable; continue Celexa 20 mg daily;  This visit occurred during the SARS-CoV-2 public health emergency.  Safety protocols were in place, including screening questions  prior to the visit, additional usage of staff PPE, and extensive cleaning of exam room while observing appropriate contact time as indicated for disinfecting solutions.     No follow-ups on file.  Orders Placed This Encounter  Procedures  . Ambulatory referral to ENT    Referral Priority:   Routine    Referral Type:   Consultation    Referral Reason:   Specialty Services Required    Requested Specialty:   Otolaryngology    Number of Visits Requested:   1    Requested Prescriptions   Signed Prescriptions Disp Refills  . esomeprazole (NEXIUM) 40 MG capsule 90 capsule 3    Sig: Take 1 capsule (40 mg total) by mouth daily at 12 noon.  Marland Kitchen albuterol (VENTOLIN HFA) 108 (90 Base) MCG/ACT inhaler 6.7 g 1    Sig: Inhale 2 puffs into the lungs every 6 (six) hours as needed for wheezing or shortness of breath.

## 2020-09-02 LAB — CYTOLOGY - PAP
Comment: NEGATIVE
Diagnosis: NEGATIVE
High risk HPV: NEGATIVE

## 2020-09-16 DIAGNOSIS — Z20822 Contact with and (suspected) exposure to covid-19: Secondary | ICD-10-CM | POA: Diagnosis not present

## 2020-09-29 MED FILL — PROPRANOLOL 10 MG TABLET: 10 | 30 days supply | Qty: 90 | Fill #2

## 2020-09-29 MED FILL — diazePAM 2 MG TABS: 2 | 30 days supply | Qty: 60 | Fill #1

## 2020-11-07 ENCOUNTER — Other Ambulatory Visit: Payer: Self-pay | Admitting: Family

## 2020-11-07 DIAGNOSIS — R498 Other voice and resonance disorders: Secondary | ICD-10-CM

## 2020-11-07 MED FILL — diazePAM 2 MG TABS: 2 | 30 days supply | Qty: 60 | Fill #0

## 2020-11-07 MED FILL — SYNTHROID 125 MCG TABLET: 125 | 90 days supply | Qty: 90 | Fill #0

## 2020-12-22 ENCOUNTER — Other Ambulatory Visit (HOSPITAL_COMMUNITY): Payer: Self-pay

## 2021-01-11 ENCOUNTER — Other Ambulatory Visit (HOSPITAL_COMMUNITY): Payer: Self-pay

## 2021-01-11 MED FILL — Diazepam Tab 2 MG: ORAL | 30 days supply | Qty: 60 | Fill #0 | Status: AC

## 2021-01-13 ENCOUNTER — Other Ambulatory Visit (HOSPITAL_COMMUNITY): Payer: Self-pay

## 2021-01-13 MED FILL — Esomeprazole Magnesium Cap Delayed Release 40 MG (Base Eq): ORAL | 90 days supply | Qty: 90 | Fill #0 | Status: AC

## 2021-02-27 ENCOUNTER — Other Ambulatory Visit: Payer: Self-pay | Admitting: Registered Nurse

## 2021-02-27 ENCOUNTER — Other Ambulatory Visit (HOSPITAL_COMMUNITY): Payer: Self-pay

## 2021-02-27 MED ORDER — PROPRANOLOL HCL 10 MG PO TABS
ORAL_TABLET | Freq: Three times a day (TID) | ORAL | 1 refills | Status: DC
  Filled 2021-02-27: qty 90, 30d supply, fill #0
  Filled 2021-05-24: qty 90, 30d supply, fill #1

## 2021-03-01 ENCOUNTER — Other Ambulatory Visit (HOSPITAL_COMMUNITY): Payer: Self-pay

## 2021-03-01 ENCOUNTER — Other Ambulatory Visit: Payer: Self-pay | Admitting: Family

## 2021-03-01 MED ORDER — CITALOPRAM HYDROBROMIDE 20 MG PO TABS
ORAL_TABLET | Freq: Every day | ORAL | 1 refills | Status: DC
  Filled 2021-03-01: qty 90, 90d supply, fill #0
  Filled 2021-05-24: qty 90, 90d supply, fill #1

## 2021-03-01 MED FILL — Propranolol HCl Cap ER 24HR 60 MG: ORAL | 90 days supply | Qty: 90 | Fill #0 | Status: AC

## 2021-03-07 ENCOUNTER — Other Ambulatory Visit: Payer: Self-pay | Admitting: Family

## 2021-03-07 DIAGNOSIS — Z1231 Encounter for screening mammogram for malignant neoplasm of breast: Secondary | ICD-10-CM

## 2021-03-10 ENCOUNTER — Other Ambulatory Visit: Payer: Self-pay

## 2021-03-10 ENCOUNTER — Ambulatory Visit
Admission: RE | Admit: 2021-03-10 | Discharge: 2021-03-10 | Disposition: A | Discharge status: Home / Self Care | Payer: 59 | Source: Ambulatory Visit

## 2021-03-10 DIAGNOSIS — Z1231 Encounter for screening mammogram for malignant neoplasm of breast: Secondary | ICD-10-CM | POA: Diagnosis not present

## 2021-03-22 ENCOUNTER — Other Ambulatory Visit (HOSPITAL_COMMUNITY): Payer: Self-pay

## 2021-03-22 ENCOUNTER — Other Ambulatory Visit: Payer: Self-pay | Admitting: Family

## 2021-03-22 DIAGNOSIS — R498 Other voice and resonance disorders: Secondary | ICD-10-CM

## 2021-03-22 MED FILL — Levothyroxine Sodium Tab 125 MCG: ORAL | 90 days supply | Qty: 90 | Fill #0 | Status: AC

## 2021-03-22 NOTE — Telephone Encounter (Signed)
Please advise on the controlled rx refill request. Pt states that she will be staying with Mickel Baas and scheduled the appt for 04/20/21 @ 10:20 am.   She couldn't do a sooner appt since she is trying to get her dads house sold in [place].

## 2021-03-23 ENCOUNTER — Other Ambulatory Visit (HOSPITAL_COMMUNITY): Payer: Self-pay

## 2021-03-23 MED ORDER — DIAZEPAM 2 MG PO TABS
ORAL_TABLET | Freq: Two times a day (BID) | ORAL | 1 refills | Status: DC | PRN
  Filled 2021-03-23: qty 60, 30d supply, fill #0
  Filled 2021-05-24: qty 60, 30d supply, fill #1

## 2021-03-24 ENCOUNTER — Other Ambulatory Visit (HOSPITAL_COMMUNITY): Payer: Self-pay

## 2021-03-30 ENCOUNTER — Other Ambulatory Visit (HOSPITAL_COMMUNITY): Payer: Self-pay

## 2021-04-20 ENCOUNTER — Ambulatory Visit: Payer: 59 | Admitting: Family

## 2021-05-24 MED FILL — Esomeprazole Magnesium Cap Delayed Release 40 MG (Base Eq): ORAL | 90 days supply | Qty: 90 | Fill #1 | Status: AC

## 2021-05-24 MED FILL — Propranolol HCl Cap ER 24HR 60 MG: ORAL | 90 days supply | Qty: 90 | Fill #1 | Status: AC

## 2021-05-25 ENCOUNTER — Other Ambulatory Visit (HOSPITAL_COMMUNITY): Payer: Self-pay

## 2021-06-29 ENCOUNTER — Other Ambulatory Visit (HOSPITAL_COMMUNITY): Payer: Self-pay

## 2021-06-29 MED FILL — Levothyroxine Sodium Tab 125 MCG: ORAL | 90 days supply | Qty: 90 | Fill #1 | Status: AC

## 2021-07-04 ENCOUNTER — Other Ambulatory Visit (HOSPITAL_COMMUNITY): Payer: Self-pay

## 2021-07-05 ENCOUNTER — Other Ambulatory Visit: Payer: Self-pay | Admitting: Family

## 2021-07-05 DIAGNOSIS — R498 Other voice and resonance disorders: Secondary | ICD-10-CM

## 2021-07-06 ENCOUNTER — Other Ambulatory Visit (HOSPITAL_COMMUNITY): Payer: Self-pay

## 2021-07-06 MED ORDER — PROPRANOLOL HCL 10 MG PO TABS
10.0000 mg | ORAL_TABLET | Freq: Three times a day (TID) | ORAL | 0 refills | Status: DC
  Filled 2021-07-06: qty 90, 30d supply, fill #0

## 2021-07-06 MED ORDER — PROPRANOLOL HCL ER 60 MG PO CP24
60.0000 mg | ORAL_CAPSULE | Freq: Every day | ORAL | 0 refills | Status: DC
Start: 2021-07-06 — End: 2021-12-04
  Filled 2021-07-06 – 2021-09-09 (×2): qty 90, 90d supply, fill #0

## 2021-07-06 MED ORDER — CITALOPRAM HYDROBROMIDE 20 MG PO TABS
ORAL_TABLET | Freq: Every day | ORAL | 0 refills | Status: DC
  Filled 2021-07-06: qty 90, fill #0
  Filled 2021-09-09: qty 90, 90d supply, fill #0

## 2021-07-06 NOTE — Telephone Encounter (Signed)
Patient will be seeing you here and she is scheduled for 07/18/21.  Requesting: diazepam Last Visit: 08/31/20 Next Visit: 07/18/21 Last Refill: 03/23/21  Please Advise

## 2021-07-07 ENCOUNTER — Other Ambulatory Visit (HOSPITAL_COMMUNITY): Payer: Self-pay

## 2021-07-07 MED ORDER — DIAZEPAM 2 MG PO TABS
2.0000 mg | ORAL_TABLET | Freq: Two times a day (BID) | ORAL | 0 refills | Status: DC | PRN
  Filled 2021-07-07 – 2021-07-24 (×2): qty 60, 30d supply, fill #0

## 2021-07-10 ENCOUNTER — Other Ambulatory Visit (HOSPITAL_COMMUNITY): Payer: Self-pay

## 2021-07-18 ENCOUNTER — Ambulatory Visit: Payer: 59 | Admitting: Family

## 2021-07-19 ENCOUNTER — Other Ambulatory Visit (HOSPITAL_COMMUNITY): Payer: Self-pay

## 2021-07-25 ENCOUNTER — Ambulatory Visit: Payer: 59 | Admitting: Family

## 2021-07-25 ENCOUNTER — Other Ambulatory Visit: Payer: Self-pay

## 2021-07-25 ENCOUNTER — Other Ambulatory Visit (HOSPITAL_COMMUNITY): Payer: Self-pay

## 2021-07-25 VITALS — Ht 65.0 in | Wt 190.4 lb

## 2021-07-25 DIAGNOSIS — R498 Other voice and resonance disorders: Secondary | ICD-10-CM | POA: Diagnosis not present

## 2021-07-25 DIAGNOSIS — E034 Atrophy of thyroid (acquired): Secondary | ICD-10-CM | POA: Diagnosis not present

## 2021-07-25 DIAGNOSIS — Z8041 Family history of malignant neoplasm of ovary: Secondary | ICD-10-CM

## 2021-07-25 DIAGNOSIS — Z13220 Encounter for screening for lipoid disorders: Secondary | ICD-10-CM | POA: Diagnosis not present

## 2021-07-25 MED ORDER — TRIAMCINOLONE ACETONIDE 0.1 % EX CREA
1.0000 "application " | TOPICAL_CREAM | Freq: Two times a day (BID) | CUTANEOUS | 0 refills | Status: AC
  Filled 2021-07-25: qty 30, 15d supply, fill #0

## 2021-07-25 NOTE — Progress Notes (Signed)
Katherine Garrett is a 55 y.o. female with the following history as recorded in EpicCare:  Patient Active Problem List   Diagnosis Date Noted   Gastroesophageal reflux disease 08/31/2020   Vocal tremor 08/05/2018   Insomnia 08/24/2016   Obese 05/12/2015   Hypothyroid 04/15/2011   Benign head tremor 04/15/2011   Anxiety 04/15/2011    Current Outpatient Medications  Medication Sig Dispense Refill   albuterol (VENTOLIN HFA) 108 (90 Base) MCG/ACT inhaler INHALE 2 PUFFS INTO THE LUNGS EVERY 6 HOURS AS NEEDED FOR WHEEZING OR SHORTNESS OF BREATH. 18 g 1   citalopram (CELEXA) 20 MG tablet TAKE 1 TABLET BY MOUTH ONCE A DAY 90 tablet 0   diazepam (VALIUM) 2 MG tablet Take 1 tablet by mouth every 12 hours as needed. 60 tablet 0   esomeprazole (NEXIUM) 40 MG capsule TAKE 1 CAPSULE BY MOUTH ONCE A DAY AT 12 NOON 90 capsule 3   ketoconazole (NIZORAL) 2 % cream Apply 1 application topically 2 (two) times daily. 30 g 1   propranolol (INDERAL) 10 MG tablet Take 1 tablet (10 mg total) by mouth 3 (three) times daily. 90 tablet 0   propranolol ER (INDERAL LA) 60 MG 24 hr capsule Take 1 capsule (60 mg total) by mouth daily. 90 capsule 0   SYNTHROID 125 MCG tablet TAKE 1 TABLET BY MOUTH ONCE DAILY BEFORE BREAKFAST 90 tablet 3   triamcinolone cream (KENALOG) 0.1 % Apply 1 application topically 2 (two) times daily. 30 g 0   VITAMIN D PO Take by mouth.     No current facility-administered medications for this visit.    Allergies: Codeine, Primidone, and Topamax [topiramate]  Past Medical History:  Diagnosis Date   Anxiety    Depression    History of TB skin testing    Hypothyroid dx 11-04-1996   Insomnia Nov 04, 2002 onset   following death of spouse   Migraines    Tremor, essential 03/2011   Urine, incontinence, stress female     Past Surgical History:  Procedure Laterality Date   Child birth  67 & 45   x's 2   TONSILLECTOMY  1970    Family History  Problem Relation Age of Onset   Arthritis Mother     Ovarian cancer Mother 93   Hyperlipidemia Mother    Anxiety disorder Mother    Depression Mother    Arthritis Father    Anxiety disorder Father    Diabetes Father 57       type 2   Seizures Father    Cirrhosis Brother        hep c   Heart disease Maternal Grandmother    Heart disease Maternal Grandfather     Social History   Tobacco Use   Smoking status: Never   Smokeless tobacco: Never  Substance Use Topics   Alcohol use: Yes    Alcohol/week: 3.0 - 4.0 standard drinks    Types: 2 - 3 Glasses of wine, 1 Standard drinks or equivalent per week    Comment: rarely    Subjective:  Concerns that her vocal tremor is worsening- does not feel that Valium is as effective; wonders if dosage needs to be increased; Would like to get yearly labs updated today;    Objective:  Vitals:   07/25/21 1531  Weight: 190 lb 6.4 oz (86.4 kg)  Height: _0  (1.651 m)    General: Well developed, well nourished, in no acute distress  Skin : Warm and dry.  Head: Normocephalic and atraumatic  Eyes: Sclera and conjunctiva clear; pupils round and reactive to light; extraocular movements intact  Lungs: Respirations unlabored; clear to auscultation bilaterally without wheeze, rales, rhonchi  CVS exam: normal rate and regular rhythm.  Neurologic: Alert and oriented; speech intact; face symmetrical; moves all extremities well; CNII-XII intact without focal deficit   Assessment:  1. Hypothyroidism due to acquired atrophy of thyroid   2. Lipid screening   3. FH: ovarian cancer   4. Vocal tremor     Plan:  Update TSH today; will adjust Synthroid accordingly; Check lipid panel; Check CA-125; Recommend that she see vocal specialist- she will check with speech therapy contacts to see if any options available in [facility] system; [facility] that could be an option as well.  This visit occurred during the SARS-CoV-2 public health emergency.  Safety protocols were in  place, including screening questions prior to the visit, additional usage of staff PPE, and extensive cleaning of exam room while observing appropriate contact time as indicated for disinfecting solutions.    No follow-ups on file.  Orders Placed This Encounter  Procedures   CBC with Differential/Platelet   Comp Met (CMET)   TSH   CA 125   Lipid panel    Requested Prescriptions   Signed Prescriptions Disp Refills   triamcinolone cream (KENALOG) 0.1 % 30 g 0    Sig: Apply 1 application topically 2 (two) times daily.

## 2021-07-25 NOTE — Patient Instructions (Signed)
Please let me know the name of the vocal specialists that your speech therapists recommend so we can get your referral updated; You will need a CPE in 6 months for your employer's purposes.

## 2021-07-26 ENCOUNTER — Other Ambulatory Visit (HOSPITAL_COMMUNITY): Payer: Self-pay

## 2021-07-26 LAB — CBC WITH DIFFERENTIAL/PLATELET
Basophils Absolute: 0.1 10*3/uL (ref 0.0–0.1)
Basophils Relative: 1.1 % (ref 0.0–3.0)
Eosinophils Absolute: 0.1 10*3/uL (ref 0.0–0.7)
Eosinophils Relative: 2 % (ref 0.0–5.0)
HCT: 42.5 % (ref 36.0–46.0)
Hemoglobin: 13.9 g/dL (ref 12.0–15.0)
Lymphocytes Relative: 31.2 % (ref 12.0–46.0)
Lymphs Abs: 2 10*3/uL (ref 0.7–4.0)
MCHC: 32.8 g/dL (ref 30.0–36.0)
MCV: 89.6 fl (ref 78.0–100.0)
Monocytes Absolute: 0.4 10*3/uL (ref 0.1–1.0)
Monocytes Relative: 6.7 % (ref 3.0–12.0)
Neutro Abs: 3.8 10*3/uL (ref 1.4–7.7)
Neutrophils Relative %: 59 % (ref 43.0–77.0)
Platelets: 236 10*3/uL (ref 150.0–400.0)
RBC: 4.74 Mil/uL (ref 3.87–5.11)
RDW: 14.9 % (ref 11.5–15.5)
WBC: 6.4 10*3/uL (ref 4.0–10.5)

## 2021-07-26 LAB — COMPREHENSIVE METABOLIC PANEL
ALT: 16 U/L (ref 0–35)
AST: 19 U/L (ref 0–37)
Albumin: 4.4 g/dL (ref 3.5–5.2)
Alkaline Phosphatase: 70 U/L (ref 39–117)
BUN: 18 mg/dL (ref 6–23)
CO2: 28 mEq/L (ref 19–32)
Calcium: 9.2 mg/dL (ref 8.4–10.5)
Chloride: 103 mEq/L (ref 96–112)
Creatinine, Ser: 0.88 mg/dL (ref 0.40–1.20)
GFR: 72.79 mL/min (ref >60.00)
Glucose, Bld: 89 mg/dL (ref 70–99)
Potassium: 4.2 mEq/L (ref 3.5–5.1)
Sodium: 140 mEq/L (ref 135–145)
Total Bilirubin: 0.4 mg/dL (ref 0.2–1.2)
Total Protein: 6.9 g/dL (ref 6.0–8.3)

## 2021-07-26 LAB — TSH: TSH: 1.68 u[IU]/mL (ref 0.35–5.50)

## 2021-07-26 LAB — LIPID PANEL
Cholesterol: 232 mg/dL — ABNORMAL HIGH (ref 0–200)
HDL: 88.6 mg/dL (ref >39.00)
LDL Cholesterol: 129 mg/dL — ABNORMAL HIGH (ref 0–99)
NonHDL: 143.43
Total CHOL/HDL Ratio: 3
Triglycerides: 74 mg/dL (ref 0.0–149.0)
VLDL: 14.8 mg/dL (ref 0.0–40.0)

## 2021-07-26 LAB — CA 125: CA 125: 8 U/mL (ref <35)

## 2021-07-27 ENCOUNTER — Other Ambulatory Visit (HOSPITAL_COMMUNITY): Payer: Self-pay

## 2021-07-27 ENCOUNTER — Other Ambulatory Visit: Payer: Self-pay | Admitting: Family

## 2021-07-27 DIAGNOSIS — E039 Hypothyroidism, unspecified: Secondary | ICD-10-CM

## 2021-07-27 MED ORDER — SYNTHROID 125 MCG PO TABS
125.0000 ug | ORAL_TABLET | Freq: Every day | ORAL | 3 refills | Status: DC
  Filled 2021-07-27 – 2021-09-25 (×2): qty 90, 90d supply, fill #0
  Filled 2021-12-28: qty 90, 90d supply, fill #1
  Filled 2022-03-24: qty 90, 90d supply, fill #2
  Filled 2022-07-03: qty 90, 90d supply, fill #3

## 2021-07-28 ENCOUNTER — Other Ambulatory Visit (HOSPITAL_COMMUNITY): Payer: Self-pay

## 2021-08-28 ENCOUNTER — Other Ambulatory Visit (HOSPITAL_COMMUNITY): Payer: Self-pay

## 2021-08-28 MED FILL — Esomeprazole Magnesium Cap Delayed Release 40 MG (Base Eq): ORAL | 90 days supply | Qty: 90 | Fill #2 | Status: AC

## 2021-09-10 ENCOUNTER — Other Ambulatory Visit (HOSPITAL_COMMUNITY): Payer: Self-pay

## 2021-09-12 ENCOUNTER — Other Ambulatory Visit (HOSPITAL_COMMUNITY): Payer: Self-pay

## 2021-09-18 ENCOUNTER — Other Ambulatory Visit: Payer: Self-pay | Admitting: Family

## 2021-09-18 DIAGNOSIS — R498 Other voice and resonance disorders: Secondary | ICD-10-CM

## 2021-09-18 NOTE — Telephone Encounter (Signed)
Requesting: diazepam Last Visit: 07/25/21 Next Visit: none Last Refill: 07/07/21  Please Advise

## 2021-09-19 NOTE — Telephone Encounter (Signed)
Please call to see if we need to help her get scheduled with a voice specialist. She was going to look into options through [facility].

## 2021-09-21 ENCOUNTER — Other Ambulatory Visit (HOSPITAL_COMMUNITY): Payer: Self-pay

## 2021-09-21 ENCOUNTER — Encounter: Payer: Self-pay | Admitting: Family

## 2021-09-21 ENCOUNTER — Other Ambulatory Visit: Payer: Self-pay | Admitting: Family

## 2021-09-21 DIAGNOSIS — R498 Other voice and resonance disorders: Secondary | ICD-10-CM

## 2021-09-21 MED ORDER — DIAZEPAM 2 MG PO TABS
2.0000 mg | ORAL_TABLET | Freq: Two times a day (BID) | ORAL | 0 refills | Status: DC | PRN
  Filled 2021-09-21: qty 60, 30d supply, fill #0

## 2021-09-22 ENCOUNTER — Other Ambulatory Visit (HOSPITAL_COMMUNITY): Payer: Self-pay

## 2021-09-25 ENCOUNTER — Other Ambulatory Visit: Payer: Self-pay | Admitting: Family

## 2021-09-25 ENCOUNTER — Other Ambulatory Visit (HOSPITAL_COMMUNITY): Payer: Self-pay

## 2021-09-25 DIAGNOSIS — R498 Other voice and resonance disorders: Secondary | ICD-10-CM

## 2021-09-25 MED ORDER — PROPRANOLOL HCL 10 MG PO TABS
10.0000 mg | ORAL_TABLET | Freq: Three times a day (TID) | ORAL | 0 refills | Status: DC
  Filled 2021-09-25: qty 90, 30d supply, fill #0

## 2021-09-27 ENCOUNTER — Other Ambulatory Visit (HOSPITAL_COMMUNITY): Payer: Self-pay

## 2021-10-16 DIAGNOSIS — K118 Other diseases of salivary glands: Secondary | ICD-10-CM | POA: Diagnosis not present

## 2021-10-16 DIAGNOSIS — R498 Other voice and resonance disorders: Secondary | ICD-10-CM | POA: Diagnosis not present

## 2021-10-16 DIAGNOSIS — G250 Essential tremor: Secondary | ICD-10-CM | POA: Diagnosis not present

## 2021-10-20 DIAGNOSIS — M170 Bilateral primary osteoarthritis of knee: Secondary | ICD-10-CM | POA: Diagnosis not present

## 2021-10-20 DIAGNOSIS — M25561 Pain in right knee: Secondary | ICD-10-CM | POA: Diagnosis not present

## 2021-10-20 DIAGNOSIS — M25562 Pain in left knee: Secondary | ICD-10-CM | POA: Diagnosis not present

## 2021-11-01 ENCOUNTER — Telehealth (HOSPITAL_BASED_OUTPATIENT_CLINIC_OR_DEPARTMENT_OTHER): Payer: Self-pay

## 2021-11-01 ENCOUNTER — Encounter: Payer: Self-pay | Admitting: Family

## 2021-11-01 ENCOUNTER — Other Ambulatory Visit: Payer: Self-pay | Admitting: Family

## 2021-11-01 ENCOUNTER — Other Ambulatory Visit (HOSPITAL_BASED_OUTPATIENT_CLINIC_OR_DEPARTMENT_OTHER): Payer: Self-pay | Admitting: Otolaryngology

## 2021-11-01 DIAGNOSIS — R498 Other voice and resonance disorders: Secondary | ICD-10-CM

## 2021-11-01 DIAGNOSIS — R221 Localized swelling, mass and lump, neck: Secondary | ICD-10-CM

## 2021-11-02 ENCOUNTER — Other Ambulatory Visit (HOSPITAL_COMMUNITY): Payer: Self-pay

## 2021-11-02 ENCOUNTER — Ambulatory Visit (HOSPITAL_BASED_OUTPATIENT_CLINIC_OR_DEPARTMENT_OTHER): Payer: 59

## 2021-11-02 MED ORDER — DIAZEPAM 2 MG PO TABS
2.0000 mg | ORAL_TABLET | Freq: Two times a day (BID) | ORAL | 0 refills | Status: DC | PRN
  Filled 2021-11-02: qty 60, 30d supply, fill #0

## 2021-11-03 ENCOUNTER — Other Ambulatory Visit: Payer: Self-pay

## 2021-11-03 ENCOUNTER — Ambulatory Visit (HOSPITAL_BASED_OUTPATIENT_CLINIC_OR_DEPARTMENT_OTHER)
Admission: RE | Admit: 2021-11-03 | Discharge: 2021-11-03 | Disposition: A | Discharge status: Home / Self Care | Payer: 59 | Source: Ambulatory Visit | Attending: Otolaryngology | Admitting: Otolaryngology

## 2021-11-03 DIAGNOSIS — R221 Localized swelling, mass and lump, neck: Secondary | ICD-10-CM | POA: Insufficient documentation

## 2021-11-03 MED ORDER — IOHEXOL 300 MG/ML  SOLN
75.0000 mL | Freq: Once | INTRAMUSCULAR | Status: AC | PRN
  Administered 2021-11-03: 75 mL via INTRAVENOUS

## 2021-11-15 HISTORY — PX: SALIVARY GLAND SURGERY: SHX768

## 2021-11-21 DIAGNOSIS — D119 Benign neoplasm of major salivary gland, unspecified: Secondary | ICD-10-CM | POA: Diagnosis not present

## 2021-11-21 DIAGNOSIS — K118 Other diseases of salivary glands: Secondary | ICD-10-CM | POA: Diagnosis not present

## 2021-11-24 ENCOUNTER — Other Ambulatory Visit: Payer: Self-pay | Admitting: Family

## 2021-11-27 ENCOUNTER — Other Ambulatory Visit (HOSPITAL_COMMUNITY): Payer: Self-pay

## 2021-11-27 MED ORDER — ESOMEPRAZOLE MAGNESIUM 40 MG PO CPDR
DELAYED_RELEASE_CAPSULE | ORAL | 0 refills | Status: DC
  Filled 2021-11-27: qty 90, 90d supply, fill #0

## 2021-11-27 MED ORDER — PROPRANOLOL HCL 10 MG PO TABS
10.0000 mg | ORAL_TABLET | Freq: Three times a day (TID) | ORAL | 0 refills | Status: DC
  Filled 2021-11-27: qty 90, 30d supply, fill #0

## 2021-12-04 ENCOUNTER — Other Ambulatory Visit: Payer: Self-pay | Admitting: Family

## 2021-12-05 ENCOUNTER — Other Ambulatory Visit (HOSPITAL_COMMUNITY): Payer: Self-pay

## 2021-12-05 MED ORDER — PROPRANOLOL HCL ER 60 MG PO CP24
60.0000 mg | ORAL_CAPSULE | Freq: Every day | ORAL | 0 refills | Status: DC
  Filled 2021-12-05: qty 90, 90d supply, fill #0

## 2021-12-05 MED ORDER — CITALOPRAM HYDROBROMIDE 20 MG PO TABS
ORAL_TABLET | Freq: Every day | ORAL | 1 refills | Status: DC
  Filled 2021-12-05: qty 90, 90d supply, fill #0
  Filled 2022-03-04: qty 90, 90d supply, fill #1

## 2021-12-17 ENCOUNTER — Other Ambulatory Visit: Payer: Self-pay | Admitting: Family

## 2021-12-17 DIAGNOSIS — R498 Other voice and resonance disorders: Secondary | ICD-10-CM

## 2021-12-18 ENCOUNTER — Other Ambulatory Visit (HOSPITAL_COMMUNITY): Payer: Self-pay

## 2021-12-18 MED ORDER — DIAZEPAM 2 MG PO TABS
2.0000 mg | ORAL_TABLET | Freq: Two times a day (BID) | ORAL | 0 refills | Status: DC | PRN
  Filled 2021-12-18: qty 60, 30d supply, fill #0

## 2021-12-18 NOTE — Telephone Encounter (Signed)
Requesting: diazepam '2mg'$   ?Contract: None ?UDS: None ?Last Visit: 07/25/21 ?Next Visit: None ?Last Refill: 11/02/2021 #60 and 0RF ? ?Please Advise ? ?

## 2021-12-28 ENCOUNTER — Other Ambulatory Visit (HOSPITAL_COMMUNITY): Payer: Self-pay

## 2022-01-19 ENCOUNTER — Other Ambulatory Visit: Payer: Self-pay | Admitting: Family

## 2022-01-19 ENCOUNTER — Other Ambulatory Visit (HOSPITAL_COMMUNITY): Payer: Self-pay

## 2022-01-19 DIAGNOSIS — R498 Other voice and resonance disorders: Secondary | ICD-10-CM

## 2022-01-19 MED ORDER — DIAZEPAM 2 MG PO TABS
2.0000 mg | ORAL_TABLET | Freq: Two times a day (BID) | ORAL | 0 refills | Status: DC | PRN
  Filled 2022-01-19: qty 60, 30d supply, fill #0

## 2022-01-19 MED ORDER — PROPRANOLOL HCL 10 MG PO TABS
10.0000 mg | ORAL_TABLET | Freq: Three times a day (TID) | ORAL | 0 refills | Status: DC
  Filled 2022-01-19: qty 90, 30d supply, fill #0

## 2022-01-29 IMAGING — MG MM DIGITAL SCREENING BILAT W/ TOMO AND CAD
8 series · 9 of 24 positions shown · non-contrast
Comparison: Previous exam(s).

CLINICAL DATA: Screening.

EXAM:
DIGITAL SCREENING BILATERAL MAMMOGRAM WITH TOMOSYNTHESIS AND CAD
TECHNIQUE: Bilateral screening digital craniocaudal and mediolateral oblique
mammograms were obtained. Bilateral screening digital breast
tomosynthesis was performed. The images were evaluated with
computer-aided detection.

[R CC synth-2D]
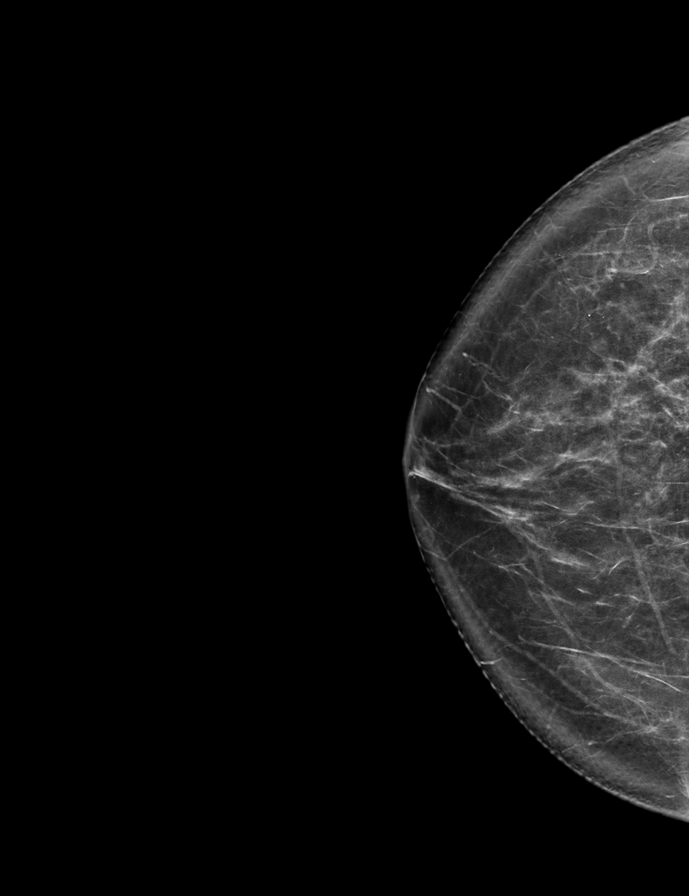

[R MLO synth-2D]
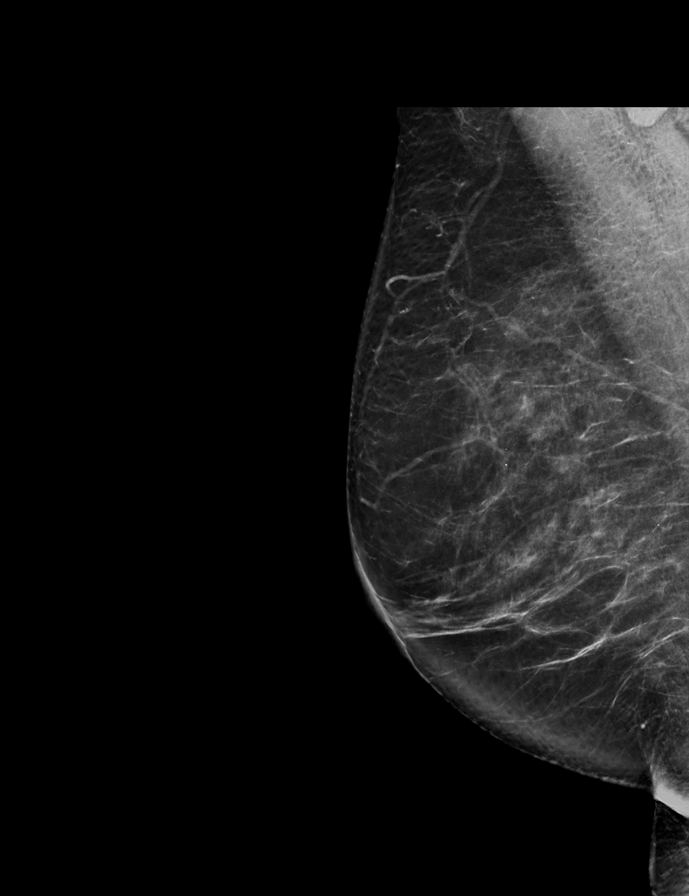

[L MLO synth-2D]
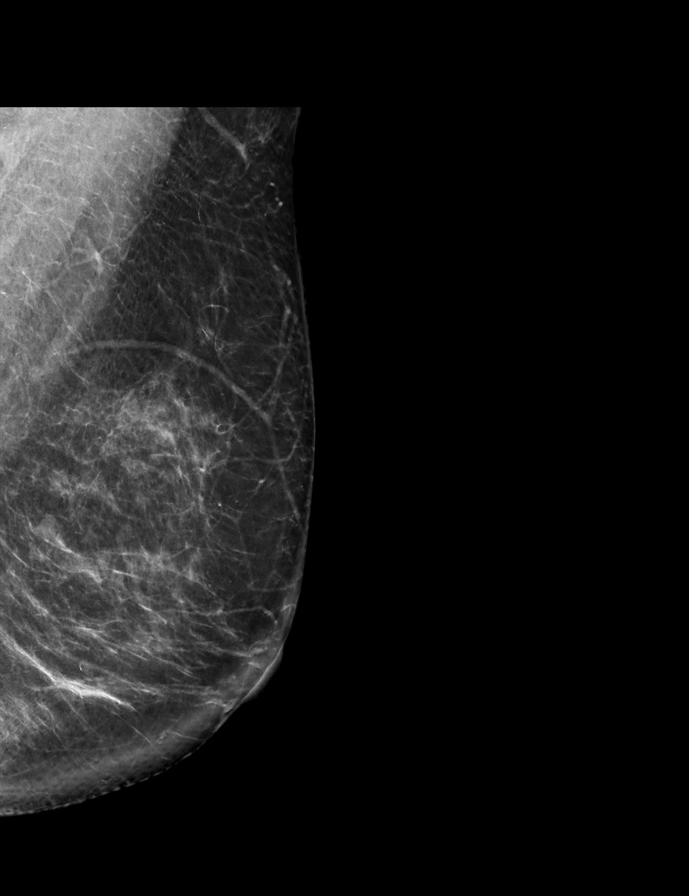

[L CC synth-2D]
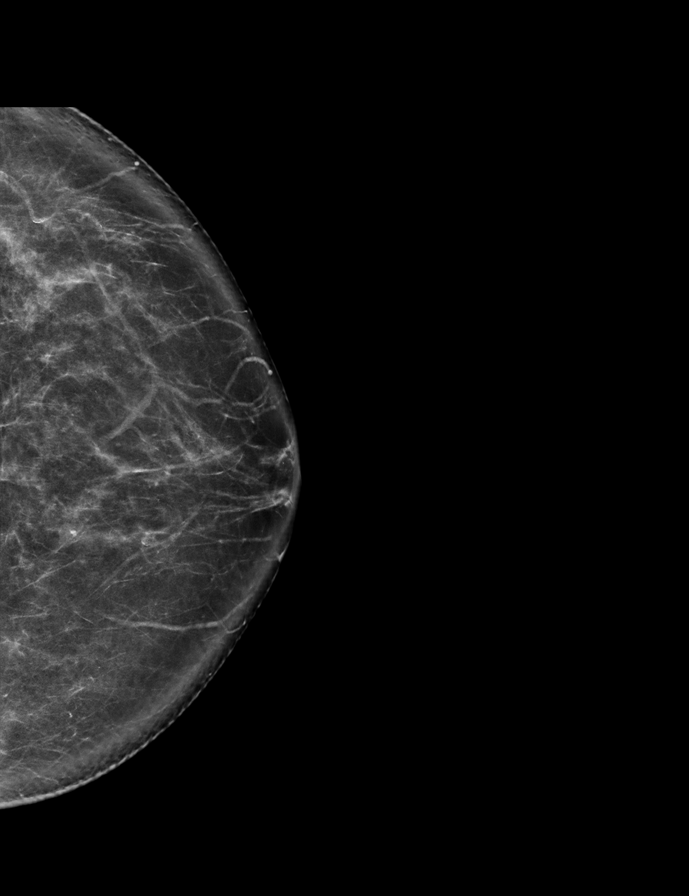

[R MLO tomo · 2 of 84 frames shown]
[frame 28/84]
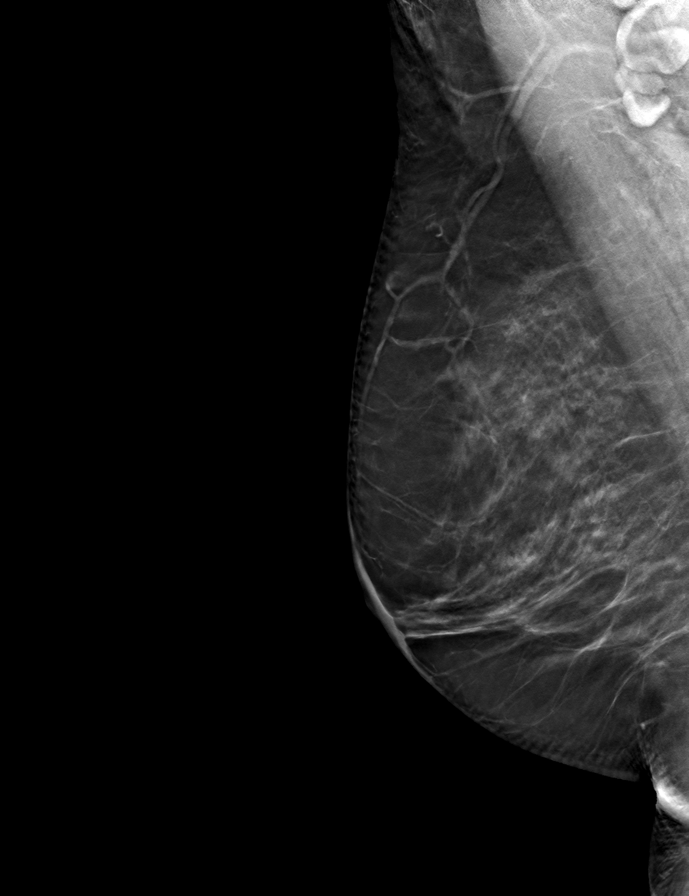
[frame 43/84]
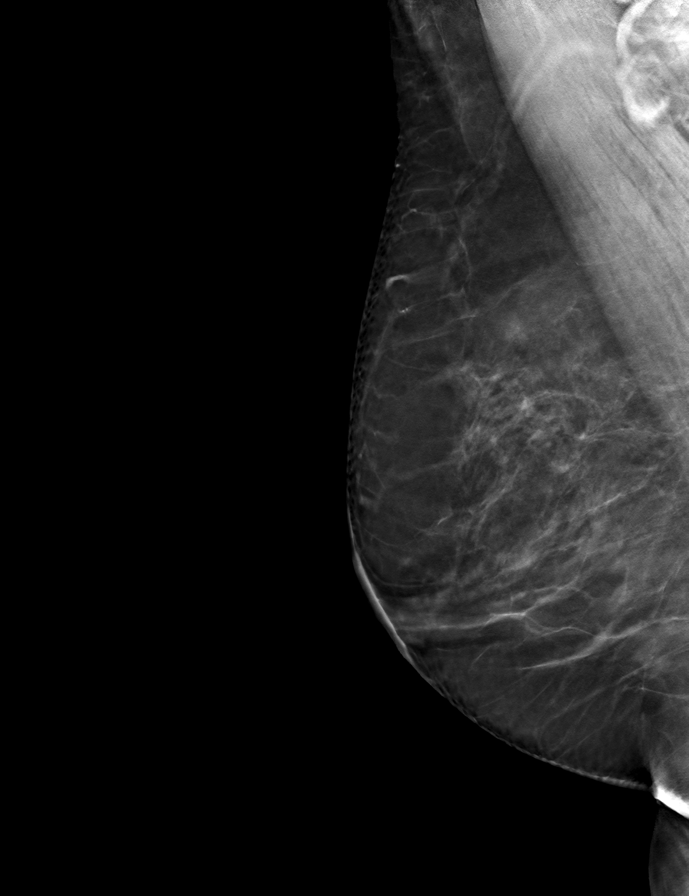

[L MLO tomo · tomo slice 45/89.0]
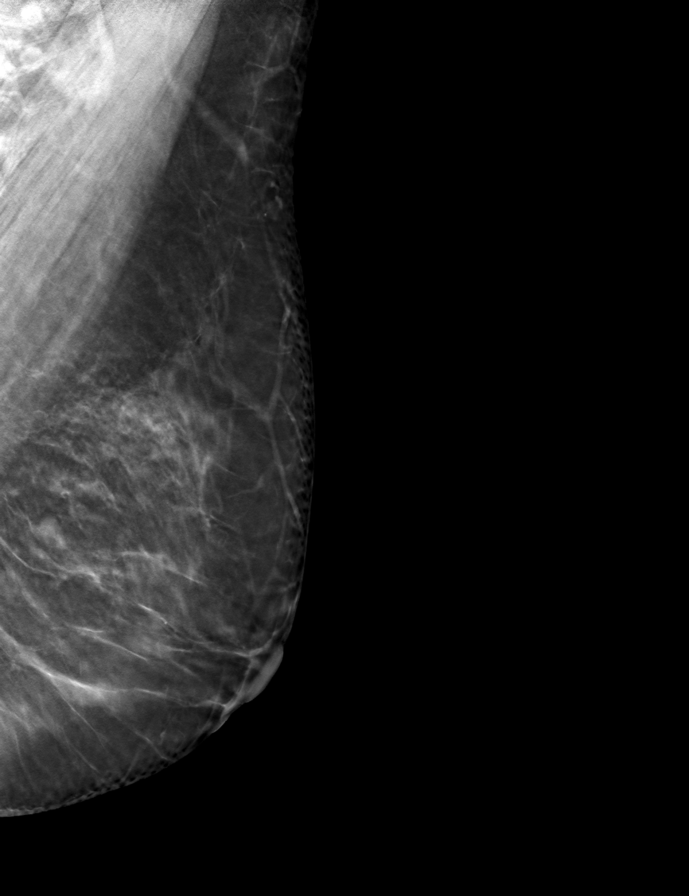

[R CC tomo · tomo slice 43/84.0]
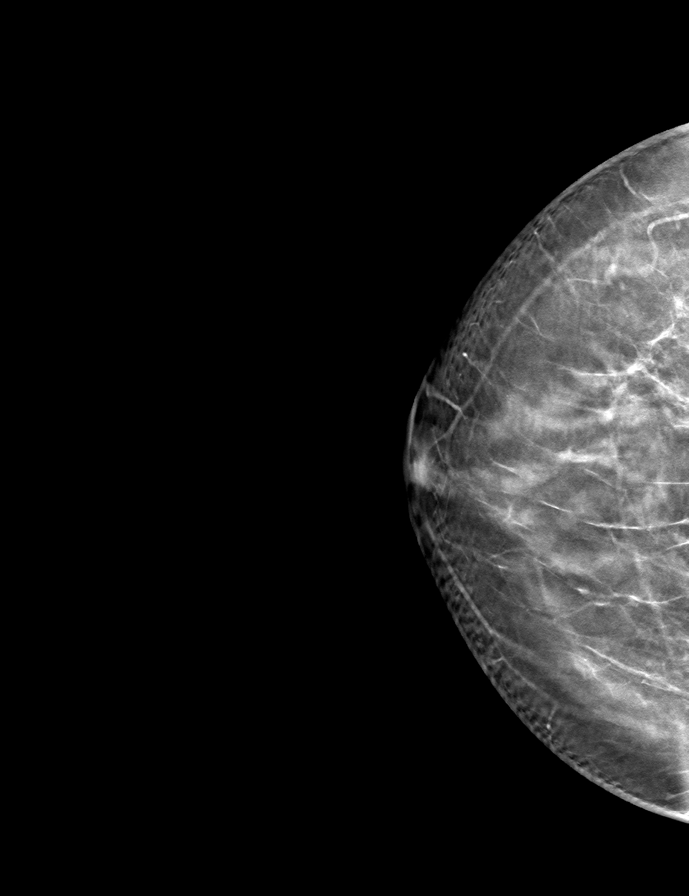

[L CC tomo · tomo slice 41/81.0]
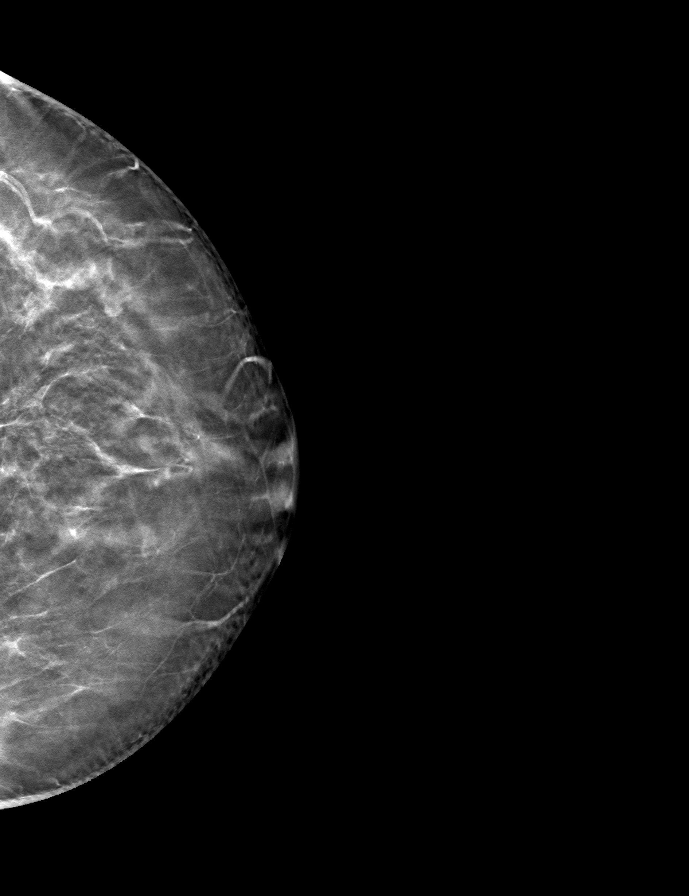

[9 of 24 positions shown; findings below may reference images not displayed]

ACR Breast Density Category b: There are scattered areas of
fibroglandular density.
FINDINGS: There are no findings suspicious for malignancy.
IMPRESSION: No mammographic evidence of malignancy. A result letter of this
screening mammogram will be mailed directly to the patient.

RECOMMENDATION:
Screening mammogram in one year. (Code:51-O-LD2)

BI-RADS CATEGORY  1: Negative.

## 2022-01-30 ENCOUNTER — Ambulatory Visit (INDEPENDENT_AMBULATORY_CARE_PROVIDER_SITE_OTHER): Payer: 59 | Admitting: Family

## 2022-01-30 ENCOUNTER — Encounter: Payer: Self-pay | Admitting: Family

## 2022-01-30 VITALS — BP 110/60 | HR 52 | Temp 97.5°F | Ht 65.0 in | Wt 186.8 lb

## 2022-01-30 DIAGNOSIS — Z0000 Encounter for general adult medical examination without abnormal findings: Secondary | ICD-10-CM | POA: Diagnosis not present

## 2022-01-30 DIAGNOSIS — Z1211 Encounter for screening for malignant neoplasm of colon: Secondary | ICD-10-CM | POA: Diagnosis not present

## 2022-01-30 DIAGNOSIS — E034 Atrophy of thyroid (acquired): Secondary | ICD-10-CM

## 2022-01-30 DIAGNOSIS — Z8041 Family history of malignant neoplasm of ovary: Secondary | ICD-10-CM

## 2022-01-30 DIAGNOSIS — Z13220 Encounter for screening for lipoid disorders: Secondary | ICD-10-CM

## 2022-01-30 DIAGNOSIS — Z23 Encounter for immunization: Secondary | ICD-10-CM | POA: Diagnosis not present

## 2022-01-30 LAB — LIPID PANEL
Cholesterol: 231 mg/dL — ABNORMAL HIGH (ref 0–200)
HDL: 79.4 mg/dL (ref >39.00)
LDL Cholesterol: 137 mg/dL — ABNORMAL HIGH (ref 0–99)
NonHDL: 151.37
Total CHOL/HDL Ratio: 3
Triglycerides: 74 mg/dL (ref 0.0–149.0)
VLDL: 14.8 mg/dL (ref 0.0–40.0)

## 2022-01-30 LAB — CBC WITH DIFFERENTIAL/PLATELET
Basophils Absolute: 0.1 10*3/uL (ref 0.0–0.1)
Basophils Relative: 0.9 % (ref 0.0–3.0)
Eosinophils Absolute: 0.1 10*3/uL (ref 0.0–0.7)
Eosinophils Relative: 1.6 % (ref 0.0–5.0)
HCT: 42 % (ref 36.0–46.0)
Hemoglobin: 13.9 g/dL (ref 12.0–15.0)
Lymphocytes Relative: 27.4 % (ref 12.0–46.0)
Lymphs Abs: 1.7 10*3/uL (ref 0.7–4.0)
MCHC: 33 g/dL (ref 30.0–36.0)
MCV: 92.5 fl (ref 78.0–100.0)
Monocytes Absolute: 0.5 10*3/uL (ref 0.1–1.0)
Monocytes Relative: 7.7 % (ref 3.0–12.0)
Neutro Abs: 3.8 10*3/uL (ref 1.4–7.7)
Neutrophils Relative %: 62.4 % (ref 43.0–77.0)
Platelets: 241 10*3/uL (ref 150.0–400.0)
RBC: 4.54 Mil/uL (ref 3.87–5.11)
RDW: 15 % (ref 11.5–15.5)
WBC: 6.1 10*3/uL (ref 4.0–10.5)

## 2022-01-30 LAB — COMPREHENSIVE METABOLIC PANEL
ALT: 22 U/L (ref 0–35)
AST: 21 U/L (ref 0–37)
Albumin: 4.1 g/dL (ref 3.5–5.2)
Alkaline Phosphatase: 78 U/L (ref 39–117)
BUN: 11 mg/dL (ref 6–23)
CO2: 29 mEq/L (ref 19–32)
Calcium: 9.8 mg/dL (ref 8.4–10.5)
Chloride: 103 mEq/L (ref 96–112)
Creatinine, Ser: 0.88 mg/dL (ref 0.40–1.20)
GFR: 72.53 mL/min (ref >60.00)
Glucose, Bld: 96 mg/dL (ref 70–99)
Potassium: 4.6 mEq/L (ref 3.5–5.1)
Sodium: 138 mEq/L (ref 135–145)
Total Bilirubin: 0.5 mg/dL (ref 0.2–1.2)
Total Protein: 6.6 g/dL (ref 6.0–8.3)

## 2022-01-30 LAB — TSH: TSH: 1.06 u[IU]/mL (ref 0.35–5.50)

## 2022-01-30 NOTE — Progress Notes (Signed)
?Katherine Garrett is a 56 y.o. female with the following history as recorded in EpicCare:  ?Patient Active Problem List  ? Diagnosis Date Noted  ? Gastroesophageal reflux disease 08/31/2020  ? Vocal tremor 08/05/2018  ? Insomnia 08/24/2016  ? Obese 05/12/2015  ? Hypothyroid 04/15/2011  ? Benign head tremor 04/15/2011  ? Anxiety 04/15/2011  ?  ?Current Outpatient Medications  ?Medication Sig Dispense Refill  ? albuterol (VENTOLIN HFA) 108 (90 Base) MCG/ACT inhaler INHALE 2 PUFFS INTO THE LUNGS EVERY 6 HOURS AS NEEDED FOR WHEEZING OR SHORTNESS OF BREATH. 18 g 1  ? citalopram (CELEXA) 20 MG tablet TAKE 1 TABLET BY MOUTH ONCE A DAY 90 tablet 1  ? diazepam (VALIUM) 2 MG tablet Take 1 tablet by mouth every 12 hours as needed. 60 tablet 0  ? esomeprazole (NEXIUM) 40 MG capsule TAKE 1 CAPSULE BY MOUTH ONCE A DAY AT 12 NOON 90 capsule 0  ? ketoconazole (NIZORAL) 2 % cream Apply 1 application topically 2 (two) times daily. 30 g 1  ? propranolol (INDERAL) 10 MG tablet Take 1 tablet (10 mg total) by mouth 3 (three) times daily. 90 tablet 0  ? propranolol ER (INDERAL LA) 60 MG 24 hr capsule Take 1 capsule (60 mg total) by mouth daily. 90 capsule 0  ? SYNTHROID 125 MCG tablet TAKE 1 TABLET BY MOUTH ONCE DAILY BEFORE BREAKFAST 90 tablet 3  ? triamcinolone cream (KENALOG) 0.1 % Apply 1 application topically 2 (two) times daily. 30 g 0  ? VITAMIN D PO Take by mouth.    ? ?No current facility-administered medications for this visit.  ?  ?Allergies: Codeine, Primidone, and Topamax [topiramate]  ?Past Medical History:  ?Diagnosis Date  ? Anxiety   ? Depression   ? History of TB skin testing   ? Hypothyroid dx 1998  ? Insomnia 11-15-02 onset  ? following death of spouse  ? Migraines   ? Tremor, essential 03/2011  ? Urine, incontinence, stress female   ?  ?Past Surgical History:  ?Procedure Laterality Date  ? Child birth  35 & 54  ? x's 2  ? TONSILLECTOMY  1970  ?  ?Family History  ?Problem Relation Age of Onset  ? Arthritis Mother   ?  Ovarian cancer Mother 19  ? Hyperlipidemia Mother   ? Anxiety disorder Mother   ? Depression Mother   ? Arthritis Father   ? Anxiety disorder Father   ? Diabetes Father 21  ?     type 2  ? Seizures Father   ? Cirrhosis Brother   ?     hep c  ? Heart disease Maternal Grandmother   ? Heart disease Maternal Grandfather   ?  ?Social History  ? ?Tobacco Use  ? Smoking status: Never  ? Smokeless tobacco: Never  ?Substance Use Topics  ? Alcohol use: Yes  ?  Alcohol/week: 3.0 - 4.0 standard drinks  ?  Types: 2 - 3 Glasses of wine, 1 Standard drinks or equivalent per week  ?  Comment: rarely  ?  ?Subjective:  ? 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 Constance Holster by the end of the year; will be going to special tremor clinic through [facility] and discuss Botox injections;  ?Chronic knee pain managed by Dr. Vella Raring; ?Would like to get colonoscopy instead of repeating Cologuard this year;  ? ?Review of Systems  ?Constitutional: Negative.   ?  HENT: Negative.    ?Eyes: Negative.   ?Respiratory: Negative.    ?Cardiovascular: Negative.   ?Gastrointestinal: Negative.   ?Genitourinary: Negative.   ?Musculoskeletal: Negative.   ?Skin: Negative.   ?Neurological:  Positive for tremors and speech change.  ?Endo/Heme/Allergies: Negative.   ?Psychiatric/Behavioral: Negative.    ? ? ? ? ? ? ?Objective:  ?Vitals:  ? 01/30/22 1037  ?BP: 110/60  ?Pulse: (!) 52  ?Temp: (!) 97.5 ?F (36.4 ?C)  ?TempSrc: Oral  ?SpO2: 95%  ?Weight: 186 lb 12.8 oz (84.7 kg)  ?Height: 5' 5"  (1.651 m)  ?  ?General: Well developed, well nourished, in no acute distress  ?Skin : Warm and dry.  ?Head: Normocephalic and atraumatic  ?Eyes: Sclera and conjunctiva clear; pupils round and reactive to light; extraocular movements intact  ?Ears: External normal; canals clear; tympanic membranes normal  ?Oropharynx: Pink, supple. No suspicious lesions  ?Neck: Supple without thyromegaly,  adenopathy  ?Lungs: Respirations unlabored; clear to auscultation bilaterally without wheeze, rales, rhonchi  ?CVS exam: normal rate and regular rhythm.  ?Abdomen: Soft; nontender; nondistended; normoactive bowel sounds; no masses or hepatosplenomegaly  ?Musculoskeletal: No deformities; no active joint inflammation  ?Extremities: No edema, cyanosis, clubbing  ?Vessels: Symmetric bilaterally  ?Neurologic: Alert and oriented; speech intact; face symmetrical; moves all extremities well; CNII-XII intact without focal deficit  ? ?Assessment:  ?1. PE (physical exam), annual   ?2. Encounter for screening colonoscopy   ?3. Lipid screening   ?4. Hypothyroidism due to acquired atrophy of thyroid   ?5. FH: ovarian cancer   ?6. Need for Tdap vaccination   ?  ?Plan:  ?Age appropriate preventive healthcare needs addressed; encouraged regular eye doctor and dental exams; encouraged regular exercise; will update labs and refills as needed today; follow-up to be determined; ?Tdap updated; patient defers Shingrix today; information given regarding Advanced Directives; ?She will follow up with ENT as scheduled through [facility] for parotid mass and vocal tremor management;  ? ? ?No follow-ups on file.  ?Orders Placed This Encounter  ?Procedures  ? Tdap vaccine greater than or equal to 7yo IM  ? CBC with Differential/Platelet  ? Comp Met (CMET)  ? Lipid panel  ? TSH  ? CA 125  ? Ambulatory referral to Gastroenterology  ?  Referral Priority:   Routine  ?  Referral Type:   Consultation  ?  Referral Reason:   Specialty Services Required  ?  Referred to Provider:   Sharyn Creamer, MD  ?  Number of Visits Requested:   1  ?  ?Requested Prescriptions  ? ? No prescriptions requested or ordered in this encounter  ?  ? ?

## 2022-01-31 LAB — CA 125: CA 125: 8 U/mL (ref <35)

## 2022-02-07 DIAGNOSIS — M25562 Pain in left knee: Secondary | ICD-10-CM | POA: Diagnosis not present

## 2022-03-04 ENCOUNTER — Other Ambulatory Visit: Payer: Self-pay | Admitting: Family

## 2022-03-04 DIAGNOSIS — R498 Other voice and resonance disorders: Secondary | ICD-10-CM

## 2022-03-05 ENCOUNTER — Other Ambulatory Visit (HOSPITAL_COMMUNITY): Payer: Self-pay

## 2022-03-05 MED ORDER — DIAZEPAM 2 MG PO TABS
2.0000 mg | ORAL_TABLET | Freq: Two times a day (BID) | ORAL | 0 refills | Status: DC | PRN
  Filled 2022-03-05: qty 60, 30d supply, fill #0

## 2022-03-05 MED ORDER — ESOMEPRAZOLE MAGNESIUM 40 MG PO CPDR
DELAYED_RELEASE_CAPSULE | ORAL | 0 refills | Status: DC
  Filled 2022-03-05: qty 90, 90d supply, fill #0

## 2022-03-06 ENCOUNTER — Other Ambulatory Visit (HOSPITAL_COMMUNITY): Payer: Self-pay

## 2022-03-06 MED ORDER — PROPRANOLOL HCL 10 MG PO TABS
10.0000 mg | ORAL_TABLET | Freq: Three times a day (TID) | ORAL | 0 refills | Status: DC
Start: 2022-03-06 — End: 2022-04-15
  Filled 2022-03-06: qty 90, 30d supply, fill #0

## 2022-03-06 MED ORDER — PROPRANOLOL HCL ER 60 MG PO CP24
60.0000 mg | ORAL_CAPSULE | Freq: Every day | ORAL | 0 refills | Status: DC
  Filled 2022-03-06: qty 90, 90d supply, fill #0

## 2022-03-16 ENCOUNTER — Encounter: Payer: Self-pay | Admitting: Family

## 2022-03-16 NOTE — Telephone Encounter (Signed)
This was coded as (PE) and annual, that should be fine right?

## 2022-03-26 ENCOUNTER — Other Ambulatory Visit (HOSPITAL_COMMUNITY): Payer: Self-pay

## 2022-04-10 ENCOUNTER — Other Ambulatory Visit (HOSPITAL_COMMUNITY): Payer: Self-pay

## 2022-04-10 ENCOUNTER — Other Ambulatory Visit: Payer: Self-pay | Admitting: Family

## 2022-04-10 DIAGNOSIS — R498 Other voice and resonance disorders: Secondary | ICD-10-CM

## 2022-04-10 MED ORDER — DIAZEPAM 2 MG PO TABS
2.0000 mg | ORAL_TABLET | Freq: Two times a day (BID) | ORAL | 0 refills | Status: DC | PRN
  Filled 2022-04-10: qty 60, 30d supply, fill #0

## 2022-04-10 NOTE — Telephone Encounter (Signed)
Requesting: diazepam '2mg'$   Contract: None UDS: None  Last Visit: 01/30/22 Next Visit: None Last Refill: 03/05/22 #60 and 0RF  Please Advise

## 2022-04-13 ENCOUNTER — Encounter: Payer: Self-pay | Admitting: Internal Medicine

## 2022-04-14 ENCOUNTER — Ambulatory Visit (HOSPITAL_COMMUNITY)
Admission: EM | Admit: 2022-04-14 | Discharge: 2022-04-14 | Disposition: A | Discharge status: Home / Self Care | Payer: 59 | Attending: Internal Medicine | Admitting: Internal Medicine

## 2022-04-14 ENCOUNTER — Ambulatory Visit (INDEPENDENT_AMBULATORY_CARE_PROVIDER_SITE_OTHER): Payer: 59

## 2022-04-14 ENCOUNTER — Encounter (HOSPITAL_COMMUNITY): Payer: Self-pay | Admitting: Emergency Medicine

## 2022-04-14 ENCOUNTER — Other Ambulatory Visit (HOSPITAL_COMMUNITY): Payer: Self-pay

## 2022-04-14 DIAGNOSIS — R059 Cough, unspecified: Secondary | ICD-10-CM

## 2022-04-14 DIAGNOSIS — Z20822 Contact with and (suspected) exposure to covid-19: Secondary | ICD-10-CM | POA: Diagnosis not present

## 2022-04-14 DIAGNOSIS — J069 Acute upper respiratory infection, unspecified: Secondary | ICD-10-CM | POA: Diagnosis not present

## 2022-04-14 MED ORDER — FLUTICASONE PROPIONATE 50 MCG/ACT NA SUSP
1.0000 | Freq: Every day | NASAL | 0 refills | Status: DC
  Filled 2022-04-14: qty 16, 30d supply, fill #0

## 2022-04-14 MED ORDER — BENZONATATE 100 MG PO CAPS
100.0000 mg | ORAL_CAPSULE | Freq: Three times a day (TID) | ORAL | 0 refills | Status: DC | PRN
  Filled 2022-04-14: qty 21, 7d supply, fill #0

## 2022-04-14 NOTE — ED Triage Notes (Signed)
Patient c/o fatigue, right ear pain, nasal drainage, headache x 6 days.  Patient feels that she may have bronchitis.  Patient is requesting COVID testing and CXR.  Patient has taken Advil Cold and Sinus.

## 2022-04-14 NOTE — Discharge Instructions (Signed)
Your chest x-ray was normal.  It appears that you have a viral upper respiratory infection that should run its course and self resolve with symptomatic treatment.  I have prescribed you 2 medications to help alleviate symptoms.  Please follow-up if symptoms persist or worsen.
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
## 2022-04-15 ENCOUNTER — Other Ambulatory Visit: Payer: Self-pay | Admitting: Family

## 2022-04-15 LAB — SARS CORONAVIRUS 2 (TAT 6-24 HRS): SARS Coronavirus 2: NEGATIVE

## 2022-04-16 ENCOUNTER — Other Ambulatory Visit (HOSPITAL_COMMUNITY): Payer: Self-pay

## 2022-04-16 MED ORDER — PROPRANOLOL HCL 10 MG PO TABS
10.0000 mg | ORAL_TABLET | Freq: Three times a day (TID) | ORAL | 0 refills | Status: DC
Start: 2022-04-16 — End: 2022-05-13
  Filled 2022-04-16: qty 90, 30d supply, fill #0

## 2022-04-30 ENCOUNTER — Ambulatory Visit (AMBULATORY_SURGERY_CENTER): Payer: Self-pay

## 2022-04-30 ENCOUNTER — Other Ambulatory Visit (HOSPITAL_COMMUNITY): Payer: Self-pay

## 2022-04-30 VITALS — Ht 65.0 in | Wt 184.0 lb

## 2022-04-30 DIAGNOSIS — Z1211 Encounter for screening for malignant neoplasm of colon: Secondary | ICD-10-CM

## 2022-04-30 MED ORDER — NA SULFATE-K SULFATE-MG SULF 17.5-3.13-1.6 GM/177ML PO SOLN
1.0000 | Freq: Once | ORAL | 0 refills | Status: AC
  Filled 2022-04-30: qty 354, 1d supply, fill #0

## 2022-04-30 NOTE — Progress Notes (Signed)
No egg or soy allergy known to patient   No issues known to pt with past sedation with any surgeries or procedures  Patient denies ever being told they had issues or difficulty with intubation   No FH of Malignant Hyperthermia  Pt is not on diet pills  Pt is not on  home 02   Pt is not on blood thinners   Pt denies issues with constipation but has bm every 2 days, miralax daily prior to colonoscopy   No A fib or A flutter  Have any cardiac testing pending--no  Pt instructed to use [email or web address] or GoodRx for a price reduction on prep

## 2022-05-09 ENCOUNTER — Other Ambulatory Visit: Payer: Self-pay | Admitting: Family

## 2022-05-09 DIAGNOSIS — R498 Other voice and resonance disorders: Secondary | ICD-10-CM

## 2022-05-10 NOTE — Telephone Encounter (Signed)
Requesting: diazepam '2mg'$   Contract: None UDS: None Last Visit: 01/30/22 Next Visit: None Last Refill: 04/10/22 #60 and 0RF  Please Advise

## 2022-05-11 ENCOUNTER — Other Ambulatory Visit (HOSPITAL_COMMUNITY): Payer: Self-pay

## 2022-05-11 MED ORDER — DIAZEPAM 2 MG PO TABS
2.0000 mg | ORAL_TABLET | Freq: Two times a day (BID) | ORAL | 0 refills | Status: DC | PRN
  Filled 2022-05-11: qty 60, 30d supply, fill #0

## 2022-05-13 ENCOUNTER — Other Ambulatory Visit: Payer: Self-pay | Admitting: Family

## 2022-05-14 ENCOUNTER — Other Ambulatory Visit: Payer: Self-pay | Admitting: Family

## 2022-05-14 ENCOUNTER — Other Ambulatory Visit (HOSPITAL_COMMUNITY): Payer: Self-pay

## 2022-05-14 ENCOUNTER — Encounter: Payer: Self-pay | Admitting: Internal Medicine

## 2022-05-14 DIAGNOSIS — Z1231 Encounter for screening mammogram for malignant neoplasm of breast: Secondary | ICD-10-CM

## 2022-05-14 MED ORDER — PROPRANOLOL HCL 10 MG PO TABS
10.0000 mg | ORAL_TABLET | Freq: Three times a day (TID) | ORAL | 0 refills | Status: DC
  Filled 2022-05-14: qty 270, 90d supply, fill #0

## 2022-05-22 ENCOUNTER — Telehealth: Payer: Self-pay | Admitting: Internal Medicine

## 2022-05-22 DIAGNOSIS — M25562 Pain in left knee: Secondary | ICD-10-CM | POA: Diagnosis not present

## 2022-05-22 DIAGNOSIS — M25561 Pain in right knee: Secondary | ICD-10-CM | POA: Diagnosis not present

## 2022-05-22 NOTE — Telephone Encounter (Signed)
Inbound call from patient requesting a call back for upcoming procedure. Please give a call back to further advise.   Thank you

## 2022-05-22 NOTE — Telephone Encounter (Signed)
All questions answered

## 2022-05-28 ENCOUNTER — Other Ambulatory Visit: Payer: Self-pay | Admitting: Family

## 2022-05-28 ENCOUNTER — Ambulatory Visit (AMBULATORY_SURGERY_CENTER): Payer: 59 | Admitting: Internal Medicine

## 2022-05-28 ENCOUNTER — Encounter: Payer: Self-pay | Admitting: Internal Medicine

## 2022-05-28 VITALS — BP 126/71 | HR 60 | Temp 96.8°F | Resp 13 | Ht 65.0 in | Wt 184.0 lb

## 2022-05-28 DIAGNOSIS — D122 Benign neoplasm of ascending colon: Secondary | ICD-10-CM | POA: Diagnosis not present

## 2022-05-28 DIAGNOSIS — D123 Benign neoplasm of transverse colon: Secondary | ICD-10-CM

## 2022-05-28 DIAGNOSIS — Z1211 Encounter for screening for malignant neoplasm of colon: Secondary | ICD-10-CM

## 2022-05-28 MED ORDER — SODIUM CHLORIDE 0.9 % IV SOLN: 500.0000 mL | Freq: Once | INTRAVENOUS | Status: DC

## 2022-05-28 NOTE — Progress Notes (Signed)
Called to room to assist during endoscopic procedure.  Patient ID and intended procedure confirmed with present staff. Received instructions for my participation in the procedure from the performing physician.  

## 2022-05-28 NOTE — Progress Notes (Signed)
GASTROENTEROLOGY PROCEDURE H&P NOTE   Primary Care Physician: Marrian Salvage, [place]    Reason for Procedure:   Colon cancer screening  Plan:    Colonoscopy  Patient is appropriate for endoscopic procedure(s) in the ambulatory ([place]) setting.  The nature of the procedure, as well as the risks, benefits, and alternatives were carefully and thoroughly reviewed with the patient. Ample time for discussion and questions allowed. The patient understood, was satisfied, and agreed to proceed.     HPI: Katherine Garrett is a 56 y.o. female who presents for colonoscopy for colon cancer screening. Denies blood in stools, changes in bowel habits, weight loss. Denies family history of colon cancer.  Past Medical History:  Diagnosis Date   Anxiety    Depression    History of TB skin testing    Hypothyroid dx 1996/12/19   Insomnia December 20, 2002 onset   following death of spouse   Migraines    Tremor, essential 03/18/2011   vocal   Urine, incontinence, stress female     Past Surgical History:  Procedure Laterality Date   Child birth  26 & 70   x's 2, nvd's   SALIVARY GLAND SURGERY  12-19-21   parotid gland biopsy   TONSILLECTOMY  09/17/1968   UPPER GASTROINTESTINAL ENDOSCOPY      Prior to Admission medications   Medication Sig Start Date End Date Taking? Authorizing Provider  citalopram (CELEXA) 20 MG tablet TAKE 1 TABLET BY MOUTH ONCE A DAY 12/05/21 12/05/22 Yes Marrian Salvage, FNP  diazepam (VALIUM) 2 MG tablet Take 1 tablet by mouth every 12 hours as needed. 05/11/22  Yes Marrian Salvage, FNP  esomeprazole (NEXIUM) 40 MG capsule TAKE 1 CAPSULE BY MOUTH ONCE A DAY AT 12 NOON 03/05/22 03/05/23 Yes Debbrah Alar, NP  fluticasone (FLONASE) 50 MCG/ACT nasal spray Place 1 spray into both nostrils daily for 3 days. Patient taking differently: Place 1 spray into both nostrils as needed. 04/14/22 05/28/22 Yes [place], Michele Rockers, FNP  Polyethylene Glycol 3350 (MIRALAX PO) Miralax   Yes  [provider]  propranolol (INDERAL) 10 MG tablet Take 1 tablet (10 mg total) by mouth 3 (three) times daily. 05/14/22  Yes Marrian Salvage, FNP  propranolol ER (INDERAL LA) 60 MG 24 hr capsule Take 1 capsule (60 mg total) by mouth daily. 03/06/22  Yes Debbrah Alar, NP  SYNTHROID 125 MCG tablet TAKE 1 TABLET BY MOUTH ONCE DAILY BEFORE BREAKFAST 07/27/21 07/27/22 Yes Marrian Salvage, FNP  albuterol (VENTOLIN HFA) 108 (90 Base) MCG/ACT inhaler INHALE 2 PUFFS INTO THE LUNGS EVERY 6 HOURS AS NEEDED FOR WHEEZING OR SHORTNESS OF BREATH. 08/31/20 04/30/22  Marrian Salvage, FNP  b complex vitamins capsule Take 1 capsule by mouth daily.    [provider]  benzonatate (TESSALON) 100 MG capsule Take 1 capsule (100 mg total) by mouth every 8 (eight) hours as needed for cough. Patient not taking: Reported on 05/28/2022 04/14/22   Teodora Medici, FNP  ketoconazole (NIZORAL) 2 % cream Apply 1 application topically 2 (two) times daily. 01/10/18   Robyn Haber, MD  triamcinolone cream (KENALOG) 0.1 % Apply 1 application topically 2 (two) times daily. 07/25/21   Marrian Salvage, FNP  VITAMIN D PO Take by mouth.    [provider]    Current Outpatient Medications  Medication Sig Dispense Refill   citalopram (CELEXA) 20 MG tablet TAKE 1 TABLET BY MOUTH ONCE A DAY 90 tablet 1   diazepam (VALIUM)  2 MG tablet Take 1 tablet by mouth every 12 hours as needed. 60 tablet 0   esomeprazole (NEXIUM) 40 MG capsule TAKE 1 CAPSULE BY MOUTH ONCE A DAY AT 12 NOON 90 capsule 0   fluticasone (FLONASE) 50 MCG/ACT nasal spray Place 1 spray into both nostrils daily for 3 days. (Patient taking differently: Place 1 spray into both nostrils as needed.) 16 g 0   Polyethylene Glycol 3350 (MIRALAX PO) Miralax     propranolol (INDERAL) 10 MG tablet Take 1 tablet (10 mg total) by mouth 3 (three) times daily. 270 tablet 0   propranolol ER (INDERAL LA) 60 MG 24 hr capsule Take 1  capsule (60 mg total) by mouth daily. 90 capsule 0   SYNTHROID 125 MCG tablet TAKE 1 TABLET BY MOUTH ONCE DAILY BEFORE BREAKFAST 90 tablet 3   albuterol (VENTOLIN HFA) 108 (90 Base) MCG/ACT inhaler INHALE 2 PUFFS INTO THE LUNGS EVERY 6 HOURS AS NEEDED FOR WHEEZING OR SHORTNESS OF BREATH. 18 g 1   b complex vitamins capsule Take 1 capsule by mouth daily.     benzonatate (TESSALON) 100 MG capsule Take 1 capsule (100 mg total) by mouth every 8 (eight) hours as needed for cough. (Patient not taking: Reported on 05/28/2022) 21 capsule 0   ketoconazole (NIZORAL) 2 % cream Apply 1 application topically 2 (two) times daily. 30 g 1   triamcinolone cream (KENALOG) 0.1 % Apply 1 application topically 2 (two) times daily. 30 g 0   VITAMIN D PO Take by mouth.     Current Facility-Administered Medications  Medication Dose Route Frequency Provider Last Rate Last Admin   0.9 %  sodium chloride infusion  500 mL Intravenous Once Sharyn Creamer, MD        Allergies as of 05/28/2022 - Review Complete 05/28/2022  Allergen Reaction Noted   Codeine Nausea And Vomiting, Anxiety, and Other (See Comments) 04/13/2011   Topiramate Other (See Comments) and Nausea And Vomiting 04/21/2019   Primidone Nausea And Vomiting 04/21/2019    Family History  Problem Relation Age of Onset   Arthritis Mother    Ovarian cancer Mother 20   Hyperlipidemia Mother    Anxiety disorder Mother    Depression Mother    Arthritis Father    Anxiety disorder Father    Diabetes Father 39       type 2   Seizures Father    Cirrhosis Brother        hep c   Heart disease Maternal Grandmother    Heart disease Maternal Grandfather    Colon cancer Neg Hx    Colon polyps Neg Hx    Esophageal cancer Neg Hx    Stomach cancer Neg Hx    Rectal cancer Neg Hx     Social History   Socioeconomic History   Marital status: Widowed    Spouse name: Not on file   Number of children: Not on file   Years of education: Not on file   Highest  education level: Not on file  Occupational History   Not on file  Tobacco Use   Smoking status: Never   Smokeless tobacco: Never  Vaping Use   Vaping Use: Never used  Substance and Sexual Activity   Alcohol use: Not Currently    Alcohol/week: 3.0 - 4.0 standard drinks of alcohol    Types: 2 - 3 Glasses of wine, 1 Standard drinks or equivalent per week    Comment: rarely, monthly 2-3 times  Drug use: Never   Sexual activity: Yes    Partners: Male    Birth control/protection: OCP, Post-menopausal  Other Topics Concern   Not on file  Social History Narrative   Husband died in Nov 30, 2002 from stomach cancer.   Lives alone, grown kids in college   working on [employer] (04/2015)   Social Determinants of Health   Financial Resource Strain: Not on file  Food Insecurity: Not on file  Transportation Needs: Not on file  Physical Activity: Not on file  Stress: Not on file  Social Connections: Not on file  Intimate Partner Violence: Not on file    Physical Exam: Vital signs in last 24 hours: BP 112/72   Pulse 60   Temp (!) 96.8 F (36 C)   Ht '5\' 5"'$  (1.651 m)   Wt 184 lb (83.5 kg)   LMP 04/11/2017   SpO2 97%   BMI 30.62 kg/m  GEN: NAD EYE: Sclerae anicteric ENT: MMM CV: Non-tachycardic Pulm: No increased work of breathing GI: Soft, NT/ND NEURO:  Alert & Oriented   [name], MD [facility]  05/28/2022 9:58 AM

## 2022-05-28 NOTE — Progress Notes (Signed)
Pt's states no medical or surgical changes since previsit or office visit. 

## 2022-05-28 NOTE — Op Note (Signed)
[place] Patient Name: Katherine Garrett Procedure Date: 05/28/2022 9:50 AM MRN: [id number] Endoscopist: Sonny Masters "[name] ,  Age: 56 Referring MD:  Date of Birth: 08/20/1966 Gender: Female Account #: [id number] Procedure:                Colonoscopy Indications:              Screening for colorectal malignant neoplasm, This                            is the patient's first colonoscopy Medicines:                Monitored Anesthesia Care Procedure:                Pre-Anesthesia Assessment:                           - Prior to the procedure, a History and Physical                            was performed, and patient medications and                            allergies were reviewed. The patient's tolerance of                            previous anesthesia was also reviewed. The risks                            and benefits of the procedure and the sedation                            options and risks were discussed with the patient.                            All questions were answered, and informed consent                            was obtained. Prior Anticoagulants: The patient has                            taken no previous anticoagulant or antiplatelet                            agents. ASA Grade Assessment: II - A patient with                            mild systemic disease. After reviewing the risks                            and benefits, the patient was deemed in                            satisfactory condition to undergo the procedure.  After obtaining informed consent, the colonoscope                            was passed under direct vision. Throughout the                            procedure, the patient's blood pressure, pulse, and                            oxygen saturations were monitored continuously. The                            Olympus CF-HQ190L [id number]) Colonoscope was                            introduced through the  anus and advanced to the the                            terminal ileum. The colonoscopy was performed                            without difficulty. The patient tolerated the                            procedure well. The quality of the bowel                            preparation was good. The terminal ileum, ileocecal                            valve, appendiceal orifice, and rectum were                            photographed. Scope In: 10:05:25 AM Scope Out: 10:30:52 AM Scope Withdrawal Time: 0 hours 20 minutes 48 seconds  Total Procedure Duration: 0 hours 25 minutes 27 seconds  Findings:                 The terminal ileum appeared normal.                           Six sessile polyps were found in the transverse                            colon and ascending colon. The polyps were 3 to 7                            mm in size. These polyps were removed with a cold                            snare. Resection and retrieval were complete.                           Multiple diverticula were found in the sigmoid  colon.                           Non-bleeding internal hemorrhoids were found during                            retroflexion. Complications:            No immediate complications. Estimated Blood Loss:     Estimated blood loss was minimal. Impression:               - The examined portion of the ileum was normal.                           - Six 3 to 7 mm polyps in the transverse colon and                            in the ascending colon, removed with a cold snare.                            Resected and retrieved.                           - Diverticulosis in the sigmoid colon.                           - Non-bleeding internal hemorrhoids. Recommendation:           - Discharge patient to home (with escort).                           - Await pathology results.                           - The findings and recommendations were discussed                             with the patient. Dr Georgian Co "Lyndee Leo" Lorenso Courier,  05/28/2022 10:37:25 AM

## 2022-05-28 NOTE — Progress Notes (Signed)
PT taken to PACU. Monitors in place. VSS. Report given to RN. 

## 2022-05-28 NOTE — Patient Instructions (Signed)
YOU HAD AN ENDOSCOPIC PROCEDURE TODAY AT [facility]:   Refer to the procedure report that was given to you for any specific questions about what was found during the examination.  If the procedure report does not answer your questions, please call your gastroenterologist to clarify.  If you requested that your care partner not be given the details of your procedure findings, then the procedure report has been included in a sealed envelope for you to review at your convenience later.  YOU SHOULD EXPECT: Some feelings of bloating in the abdomen. Passage of more gas than usual.  Walking can help get rid of the air that was put into your GI tract during the procedure and reduce the bloating. If you had a lower endoscopy (such as a colonoscopy or flexible sigmoidoscopy) you may notice spotting of blood in your stool or on the toilet paper. If you underwent a bowel prep for your procedure, you may not have a normal bowel movement for a few days.  Please Note:  You might notice some irritation and congestion in your nose or some drainage.  This is from the oxygen used during your procedure.  There is no need for concern and it should clear up in a day or so.  SYMPTOMS TO REPORT IMMEDIATELY:  Following lower endoscopy (colonoscopy or flexible sigmoidoscopy):  Excessive amounts of blood in the stool  Significant tenderness or worsening of abdominal pains  Swelling of the abdomen that is new, acute  Fever of 100F or higher   For urgent or emergent issues, a gastroenterologist can be reached at any hour by calling [phone]. Do not use MyChart messaging for urgent concerns.    DIET:  We do recommend a small meal at first, but then you may proceed to your regular diet.  Drink plenty of fluids but you should avoid alcoholic beverages for 24 hours.  MEDICATIONS: Continue present medications.  Please see handouts given to you by your recovery nurse.  Thank you for allowing [place] to  provide your healthcare needs today.  ACTIVITY:  You should plan to take it easy for the rest of today and you should NOT DRIVE or use heavy machinery until tomorrow (because of the sedation medicines used during the test).    FOLLOW UP: Our staff will call the number listed on your records the next business day following your procedure.  We will call around 7:15- 8:00 am to check on you and address any questions or concerns that you may have regarding the information given to you following your procedure. If we do not reach you, we will leave a message.     If any biopsies were taken you will be contacted by phone or by letter within the next 1-3 weeks.  Please call us at [phone] if you have not heard about the biopsies in 3 weeks.    SIGNATURES/CONFIDENTIALITY: You and/or your care partner have signed paperwork which will be entered into your electronic medical record.  These signatures attest to the fact that that the information above on your After Visit Summary has been reviewed and is understood.  Full responsibility of the confidentiality of this discharge information lies with you and/or your care-partner.

## 2022-05-29 ENCOUNTER — Telehealth: Payer: Self-pay

## 2022-05-29 ENCOUNTER — Other Ambulatory Visit (HOSPITAL_COMMUNITY): Payer: Self-pay

## 2022-05-29 MED ORDER — ESOMEPRAZOLE MAGNESIUM 40 MG PO CPDR
DELAYED_RELEASE_CAPSULE | ORAL | 0 refills | Status: DC
  Filled 2022-05-29: qty 90, 90d supply, fill #0

## 2022-05-29 NOTE — Telephone Encounter (Signed)
  Follow up Call-     05/28/2022    9:37 AM  Call back number  Post procedure Call Back phone  # [phone]  Permission to leave phone message Yes     Patient questions:  Do you have a fever, pain , or abdominal swelling? No. Pain Score  0 *  Have you tolerated food without any problems? Yes.    Have you been able to return to your normal activities? Yes.    Do you have any questions about your discharge instructions: Diet   No. Medications  No. Follow up visit  No.  Do you have questions or concerns about your Care? No.  Actions: * If pain score is 4 or above: No action needed, pain <4.

## 2022-06-02 ENCOUNTER — Other Ambulatory Visit: Payer: Self-pay | Admitting: Family

## 2022-06-04 ENCOUNTER — Other Ambulatory Visit (HOSPITAL_COMMUNITY): Payer: Self-pay

## 2022-06-04 MED ORDER — CITALOPRAM HYDROBROMIDE 20 MG PO TABS
20.0000 mg | ORAL_TABLET | Freq: Every day | ORAL | 0 refills | Status: DC
  Filled 2022-06-04: qty 90, 90d supply, fill #0

## 2022-06-04 MED ORDER — PROPRANOLOL HCL ER 60 MG PO CP24
60.0000 mg | ORAL_CAPSULE | Freq: Every day | ORAL | 0 refills | Status: DC
Start: 2022-06-04 — End: 2022-08-27
  Filled 2022-06-04: qty 90, 90d supply, fill #0

## 2022-06-05 ENCOUNTER — Ambulatory Visit: Payer: 59

## 2022-06-05 ENCOUNTER — Encounter: Payer: Self-pay | Admitting: Internal Medicine

## 2022-06-07 ENCOUNTER — Other Ambulatory Visit: Payer: Self-pay | Admitting: Family

## 2022-06-07 ENCOUNTER — Other Ambulatory Visit (HOSPITAL_COMMUNITY): Payer: Self-pay

## 2022-06-07 DIAGNOSIS — R498 Other voice and resonance disorders: Secondary | ICD-10-CM

## 2022-06-07 MED ORDER — DIAZEPAM 2 MG PO TABS
2.0000 mg | ORAL_TABLET | Freq: Two times a day (BID) | ORAL | 0 refills | Status: DC | PRN
  Filled 2022-06-07 – 2022-06-08 (×2): qty 60, 30d supply, fill #0

## 2022-06-07 NOTE — Telephone Encounter (Signed)
Requesting: diazepam '2mg'$   Contract: None [id number] Last Visit: 01/30/22 Next Visit: None Last Refill: 05/11/22 #60 and 0RF  Please Advise

## 2022-06-08 ENCOUNTER — Other Ambulatory Visit (HOSPITAL_COMMUNITY): Payer: Self-pay

## 2022-06-20 ENCOUNTER — Other Ambulatory Visit: Payer: Self-pay | Admitting: Family

## 2022-06-20 ENCOUNTER — Other Ambulatory Visit (HOSPITAL_COMMUNITY): Payer: Self-pay

## 2022-06-20 MED ORDER — ALBUTEROL SULFATE HFA 108 (90 BASE) MCG/ACT IN AERS
2.0000 | INHALATION_SPRAY | Freq: Four times a day (QID) | RESPIRATORY_TRACT | 1 refills | Status: AC | PRN
  Filled 2022-06-20: qty 6.7, 25d supply, fill #0

## 2022-06-21 ENCOUNTER — Telehealth (INDEPENDENT_AMBULATORY_CARE_PROVIDER_SITE_OTHER): Payer: 59 | Admitting: Family Medicine

## 2022-06-21 ENCOUNTER — Telehealth: Payer: Self-pay | Admitting: Family

## 2022-06-21 ENCOUNTER — Other Ambulatory Visit (HOSPITAL_COMMUNITY): Payer: Self-pay

## 2022-06-21 DIAGNOSIS — U071 COVID-19: Secondary | ICD-10-CM

## 2022-06-21 MED ORDER — MOLNUPIRAVIR EUA 200MG CAPSULE
4.0000 | ORAL_CAPSULE | Freq: Two times a day (BID) | ORAL | 0 refills | Status: AC
  Filled 2022-06-21: qty 40, 5d supply, fill #0

## 2022-06-21 MED ORDER — HYDROCODONE BIT-HOMATROP MBR 5-1.5 MG/5ML PO SOLN
5.0000 mL | Freq: Four times a day (QID) | ORAL | 0 refills | Status: DC | PRN
  Filled 2022-06-21: qty 120, 6d supply, fill #0

## 2022-06-21 NOTE — Telephone Encounter (Signed)
error 

## 2022-06-22 ENCOUNTER — Encounter: Payer: Self-pay | Admitting: Family Medicine

## 2022-06-22 ENCOUNTER — Other Ambulatory Visit: Payer: Self-pay | Admitting: Family Medicine

## 2022-06-22 DIAGNOSIS — U071 COVID-19: Secondary | ICD-10-CM | POA: Insufficient documentation

## 2022-06-22 MED ORDER — NYSTATIN 100000 UNIT/ML MT SUSP
5.0000 mL | Freq: Four times a day (QID) | OROMUCOSAL | 0 refills | Status: DC | PRN
  Filled 2022-06-22: qty 240, 12d supply, fill #0

## 2022-06-22 NOTE — Progress Notes (Signed)
MyChart Video Visit    Virtual Visit via Video Note   This visit type was conducted due to national recommendations for restrictions regarding the COVID-19 Pandemic (e.g. social distancing) in an effort to limit this patient's exposure and mitigate transmission in our community. This patient is at least at moderate risk for complications without adequate follow up. This format is felt to be most appropriate for this patient at this time. Physical exam was limited by quality of the video and audio technology used for the visit. Shamaine, CMA was able to get the patient set up on a video visit.  Patient location: home Patient and provider in visit Provider location: Office  I discussed the limitations of evaluation and management by telemedicine and the availability of in person appointments. The patient expressed understanding and agreed to proceed.  Visit Date: 06/21/2022  Today's healthcare provider: Penni Homans, MD     Subjective:    Patient ID: Katherine Garrett, female    DOB: September 12, 1966, 56 y.o.   MRN: [id number]  Chief Complaint  Patient presents with   Covid Positive    Covid positive body aches, coughing     HPI Patient is in today for evaluation of new onset COVID 19.   She tested positive on 06/21/2022 after her symptoms started with congestion, HA on 06/19/2022. She notes HA, malaise, myalgias, right > left ear pain, cough is dry and she has been using otc. Denies CP/palp/SOB/GI or GU c/o. 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   Urine, incontinence, stress female     Past Surgical History:  Procedure Laterality Date   Child birth  4 & 34   x's 2, nvd's   SALIVARY GLAND SURGERY  December 26, 2021   parotid gland biopsy   TONSILLECTOMY  09/17/1968   UPPER  GASTROINTESTINAL ENDOSCOPY      Family History  Problem Relation Age of Onset   Arthritis Mother    Ovarian cancer Mother 19   Hyperlipidemia Mother    Anxiety disorder Mother    Depression Mother    Arthritis Father    Anxiety disorder Father    Diabetes Father 15       type 2   Seizures Father    Cirrhosis Brother        hep c   Heart disease Maternal Grandmother    Heart disease Maternal Grandfather    Colon cancer Neg Hx    Colon polyps Neg Hx    Esophageal cancer Neg Hx    Stomach cancer Neg Hx    Rectal cancer Neg Hx     Social History   Socioeconomic History   Marital status: Widowed    Spouse name: Not on file   Number of children: Not on file   Years of education: Not on file   Highest education level: Not on file  Occupational History   Not on file  Tobacco Use   Smoking status: Never   Smokeless tobacco: Never  Vaping Use   Vaping Use: Never used  Substance and Sexual Activity   Alcohol use: Not Currently    Alcohol/week: 3.0 - 4.0 standard drinks of alcohol    Types: 2 - 3 Glasses  of wine, 1 Standard drinks or equivalent per week    Comment: rarely, monthly 2-3 times   Drug use: Never   Sexual activity: Yes    Partners: Male    Birth control/protection: OCP, [name]  Other Topics Concern   Not on file  Social History Narrative   Husband died in November 24, 2002 from stomach cancer.   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 6.7 g 1    b complex vitamins capsule Take 1 capsule by mouth daily.     benzonatate (TESSALON) 100 MG capsule Take 1 capsule (100 mg total) by mouth every 8 (eight) hours as needed for cough. (Patient not taking: Reported on 05/28/2022) 21 capsule 0   citalopram (CELEXA) 20 MG tablet Take 1 tablet (20 mg total) by mouth daily. 90 tablet 0   diazepam (VALIUM) 2 MG tablet Take 1 tablet by mouth every 12 hours as needed. 60 tablet 0   esomeprazole (NEXIUM) 40 MG capsule TAKE 1 CAPSULE BY MOUTH ONCE A DAY AT 12 NOON 90 capsule 0   fluticasone (FLONASE) 50 MCG/ACT nasal spray Place 1 spray into both nostrils daily for 3 days. (Patient taking differently: Place 1 spray into both nostrils as needed.) 16 g 0   ketoconazole (NIZORAL) 2 % cream Apply 1 application topically 2 (two) times daily. 30 g 1   Polyethylene Glycol 3350 (MIRALAX PO) Miralax     propranolol (INDERAL) 10 MG tablet Take 1 tablet (10 mg total) by mouth 3 (three) times daily. 270 tablet 0   propranolol ER (INDERAL LA) 60 MG 24 hr capsule Take 1 capsule (60 mg total) by mouth daily. 90 capsule 0   SYNTHROID 125 MCG tablet TAKE 1 TABLET BY MOUTH ONCE DAILY BEFORE BREAKFAST 90 tablet 3   triamcinolone cream (KENALOG) 0.1 % Apply 1 application topically 2 (two) times daily. 30 g 0   VITAMIN D PO Take by mouth.     Facility-Administered Medications Prior to Visit  Medication Dose Route Frequency Provider Last Rate Last Admin   0.9 %  sodium chloride infusion  500 mL Intravenous Once Sharyn Creamer, MD        Allergies  Allergen Reactions   Codeine Nausea And Vomiting, Anxiety and Other (See Comments)   Topiramate Other (See Comments) and Nausea And Vomiting    Bone pain  Bone pain     Primidone Nausea And Vomiting    Bone pain  Bone pain     Review of Systems  Constitutional:  Positive for chills, diaphoresis and malaise/fatigue. Negative for fever.  HENT:  Positive for congestion, ear pain, sinus pain and sore throat. Negative for ear  discharge and nosebleeds.   Eyes:  Negative for blurred vision.  Respiratory:  Positive for cough and sputum production. Negative for shortness of breath.   Cardiovascular:  Negative for chest pain, palpitations and leg swelling.  Gastrointestinal:  Negative for abdominal pain, blood in stool  and nausea.  Genitourinary:  Negative for dysuria and frequency.  Musculoskeletal:  Positive for myalgias. Negative for falls.  Skin:  Negative for rash.  Neurological:  Negative for dizziness, loss of consciousness and headaches.  Endo/Heme/Allergies:  Negative for environmental allergies.  Psychiatric/Behavioral:  Negative for depression. The patient is not nervous/anxious.        Objective:    Physical Exam Constitutional:      General: She is not in acute distress.    Appearance: Normal appearance. She is ill-appearing. She is not toxic-appearing.  HENT:     Head: Normocephalic and atraumatic.     Right Ear: External ear normal.     Left Ear: External ear normal.     Nose: Nose normal.  Eyes:     General:        Right eye: No discharge.        Left eye: No discharge.  Pulmonary:     Effort: Pulmonary effort is normal.  Skin:    Findings: No rash.  Neurological:     Mental Status: She is alert and oriented to person, place, and time.  Psychiatric:        Behavior: Behavior normal.     Pulse 96   Temp (!) 97 F (36.1 C) (Oral)   LMP 04/11/2017  Wt Readings from Last 3 Encounters:  05/28/22 184 lb (83.5 kg)  04/30/22 184 lb (83.5 kg)  04/14/22 186 lb (84.4 kg)    Diabetic Foot Exam - Simple   No data filed    Lab Results  Component Value Date   WBC 6.1 01/30/2022   HGB 13.9 01/30/2022   HCT 42.0 01/30/2022   PLT 241.0 01/30/2022   GLUCOSE 96 01/30/2022   CHOL 231 (H) 01/30/2022   TRIG 74.0 01/30/2022   HDL 79.40 01/30/2022   LDLDIRECT 110.1 08/10/2013   LDLCALC 137 (H) 01/30/2022   ALT 22 01/30/2022   AST 21 01/30/2022   NA 138 01/30/2022   K 4.6 01/30/2022    CL 103 01/30/2022   CREATININE 0.88 01/30/2022   BUN 11 01/30/2022   CO2 29 01/30/2022   TSH 1.06 01/30/2022   HGBA1C 5.3 08/24/2016    Lab Results  Component Value Date   TSH 1.06 01/30/2022   Lab Results  Component Value Date   WBC 6.1 01/30/2022   HGB 13.9 01/30/2022   HCT 42.0 01/30/2022   MCV 92.5 01/30/2022   PLT 241.0 01/30/2022   Lab Results  Component Value Date   NA 138 01/30/2022   K 4.6 01/30/2022   CO2 29 01/30/2022   GLUCOSE 96 01/30/2022   BUN 11 01/30/2022   CREATININE 0.88 01/30/2022   BILITOT 0.5 01/30/2022   ALKPHOS 78 01/30/2022   AST 21 01/30/2022   ALT 22 01/30/2022   PROT 6.6 01/30/2022   ALBUMIN 4.1 01/30/2022   CALCIUM 9.8 01/30/2022   GFR 72.53 01/30/2022   Lab Results  Component Value Date   CHOL 231 (H) 01/30/2022   Lab Results  Component Value Date   HDL 79.40 01/30/2022   Lab Results  Component Value Date   LDLCALC 137 (H) 01/30/2022   Lab Results  Component Value Date   TRIG 74.0 01/30/2022   Lab Results  Component Value Date   CHOLHDL 3 01/30/2022   Lab Results  Component Value Date   HGBA1C 5.3 08/24/2016       Assessment & Plan:   Problem List Items Addressed This Visit  COVID-19    She tested positive on 06/21/2022 after her symptoms started with congestion, HA on 06/19/2022. She notes HA, malaise, myalgias, right > left ear pain, cough is dry and she has been using otc  Robitussin but the cough is still bad and keeping her up at night. Encouraged increased rest and hydration, add probiotics, zinc such as Coldeze or Xicam. Treat fevers as needed. Given a prescription for Molnupiravir as directed. Also given Hydromet cough syrup to use prn      Relevant Medications   molnupiravir EUA (LAGEVRIO) 200 mg CAPS capsule    I am having Galilea A. Bushart start on HYDROcodone bit-homatropine and molnupiravir EUA. I am also having her maintain her ketoconazole, VITAMIN D PO, triamcinolone cream, Synthroid,  fluticasone, benzonatate, Polyethylene Glycol 3350 (MIRALAX PO), b complex vitamins, propranolol, esomeprazole, propranolol ER, citalopram, diazepam, and albuterol. We will continue to administer sodium chloride.  Meds ordered this encounter  Medications   HYDROcodone bit-homatropine (HYDROMET) 5-1.5 MG/5ML syrup    Sig: Take 5 mLs by mouth every 6 (six) hours as needed for cough.    Dispense:  120 mL    Refill:  0   molnupiravir EUA (LAGEVRIO) 200 mg CAPS capsule    Sig: Take 4 capsules (800 mg total) by mouth 2 (two) times daily for 5 days.    Dispense:  40 capsule    Refill:  0    I discussed the assessment and treatment plan with the patient. The patient was provided an opportunity to ask questions and all were answered. The patient agreed with the plan and demonstrated an understanding of the instructions.   The patient was advised to call back or seek an in-person evaluation if the symptoms worsen or if the condition fails to improve as anticipated.    Penni Homans, MD [facility] [phone] (phone) [phone] (fax)  [place]

## 2022-06-22 NOTE — Assessment & Plan Note (Signed)
She tested positive on 06/21/2022 after her symptoms started with congestion, HA on 06/19/2022. She notes HA, malaise, myalgias, right > left ear pain, cough is dry and she has been using otc  Robitussin but the cough is still bad and keeping her up at night. Encouraged increased rest and hydration, add probiotics, zinc such as Coldeze or Xicam. Treat fevers as needed. Given a prescription for Molnupiravir as directed. Also given Hydromet cough syrup to use prn

## 2022-06-23 ENCOUNTER — Other Ambulatory Visit (HOSPITAL_COMMUNITY): Payer: Self-pay

## 2022-06-28 ENCOUNTER — Ambulatory Visit: Payer: 59

## 2022-07-03 ENCOUNTER — Other Ambulatory Visit (HOSPITAL_COMMUNITY): Payer: Self-pay

## 2022-07-03 ENCOUNTER — Other Ambulatory Visit (HOSPITAL_COMMUNITY): Payer: Self-pay | Admitting: Internal Medicine

## 2022-07-10 DIAGNOSIS — K118 Other diseases of salivary glands: Secondary | ICD-10-CM | POA: Diagnosis not present

## 2022-07-13 ENCOUNTER — Other Ambulatory Visit (HOSPITAL_COMMUNITY): Payer: Self-pay

## 2022-07-24 ENCOUNTER — Other Ambulatory Visit (HOSPITAL_COMMUNITY): Payer: Self-pay

## 2022-07-24 ENCOUNTER — Other Ambulatory Visit: Payer: Self-pay | Admitting: Family

## 2022-07-24 DIAGNOSIS — R498 Other voice and resonance disorders: Secondary | ICD-10-CM

## 2022-07-24 MED ORDER — DIAZEPAM 2 MG PO TABS
2.0000 mg | ORAL_TABLET | Freq: Two times a day (BID) | ORAL | 0 refills | Status: DC | PRN
  Filled 2022-07-24: qty 60, 30d supply, fill #0

## 2022-07-24 NOTE — Telephone Encounter (Signed)
Requesting: diazepam '2mg'$   Contract: None UDS: None Last Visit: 01/30/22 Next Visit: None Last Refill: 06/07/22 #60 and 0RF   Please Advise

## 2022-07-31 ENCOUNTER — Ambulatory Visit
Admission: RE | Admit: 2022-07-31 | Discharge: 2022-07-31 | Disposition: A | Discharge status: Home / Self Care | Payer: 59 | Source: Ambulatory Visit | Attending: Family | Admitting: Family

## 2022-07-31 DIAGNOSIS — Z1231 Encounter for screening mammogram for malignant neoplasm of breast: Secondary | ICD-10-CM | POA: Diagnosis not present

## 2022-08-08 ENCOUNTER — Other Ambulatory Visit: Payer: Self-pay | Admitting: Family

## 2022-08-08 ENCOUNTER — Other Ambulatory Visit (HOSPITAL_COMMUNITY): Payer: Self-pay

## 2022-08-08 MED ORDER — PROPRANOLOL HCL 10 MG PO TABS
10.0000 mg | ORAL_TABLET | Freq: Three times a day (TID) | ORAL | 0 refills | Status: DC
  Filled 2022-08-08: qty 270, 90d supply, fill #0

## 2022-08-14 ENCOUNTER — Encounter (HOSPITAL_BASED_OUTPATIENT_CLINIC_OR_DEPARTMENT_OTHER): Payer: Self-pay | Admitting: Otolaryngology

## 2022-08-14 ENCOUNTER — Other Ambulatory Visit: Payer: Self-pay

## 2022-08-14 NOTE — H&P (Signed)
HPI:   Katherine Garrett is a 56 y.o. female who presents as a consult Patient.   Referring Provider: Pcp, Verify Patient Has  Chief complaint: Parotid mass.  HPI: He had a mass along the right side of the angle of the jaw for about 3 to 4 years without change. No history of skin cancer. She would like to have this removed. 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 She is under the care of a neurologist.  PMH/Meds/All/SocHx/FamHx/ROS:   Past Medical History:  Diagnosis Date   Benign essential tremor  affects voice   Hyperthyroidism   Osteoarthritis  knees   Parotid mass   Past Surgical History:  Procedure Laterality Date   ESOPHAGOGASTRODUODENOSCOPY   MYRINGOTOMY W/ TUBES  childhood   TONSILLECTOMY   No family history of bleeding disorders, wound healing problems or difficulty with anesthesia.   Social History   Socioeconomic History   Marital status: Widowed  Spouse name: Not on file   Number of children: Not on file   Years of education: Not on file   Highest education level: Not on file  Occupational History   Not on file  Tobacco Use   Smoking status: Never   Smokeless tobacco: Never  Vaping Use   Vaping Use: Former  Substance and Sexual Activity   Alcohol use: Yes  Alcohol/week: 1.0 standard drink of alcohol  Types: 1 Glasses of wine per week  Comment: 3-4 drinks per month   Drug use: Never   Sexual activity: Yes  Other Topics Concern   Not on file  Social History Narrative   Not on file   Social Determinants of Health   Financial Resource Strain: Not on file  Food Insecurity: Not on file  Transportation Needs: Not on file  Physical Activity: Not on file  Stress: Not on file  Social Connections: Not on file  Housing Stability: Not on file   Current Outpatient Medications:   CALCIUM-VITAMIN D3-MAGNESIUM ORAL, Take 1 tablet by mouth daily., Disp: , Rfl:   diazePAM (VALIUM) 2 MG tablet, Take 1 tablet (2 mg total) by mouth every 12 (twelve) hours as  needed., Disp: , Rfl:   levothyroxine (SYNTHROID) 125 MCG tablet, Take 1 tablet (125 mcg total) by mouth., Disp: , Rfl:   propranoloL (INDERAL) 10 MG tablet, Take 1 tablet (10 mg total) by mouth., Disp: , Rfl:   propranolol LA (INDERAL LA) 60 MG 24 hr capsule, Take 1 capsule (60 mg total) by mouth daily., Disp: , Rfl:   citalopram (CELEXA) 20 MG tablet, Take 1 tablet (20 mg total) by mouth daily., Disp: 30 tablet, Rfl: 11  esomeprazole (NEXIUM) 40 MG capsule, TAKE 1 CAPSULE BY MOUTH ONCE A DAY AT 12 NOON, Disp: , Rfl:   A complete ROS was performed with pertinent positives/negatives noted in the HPI. The remainder of the ROS are negative.   Physical Exam:   Temp 97.1 F (36.2 C)  Ht 1.651 m ('5\' 5"'$ )  Wt 82.3 kg (181 lb 6.4 oz)  BMI 30.19 kg/m   General: Healthy and alert, in no distress, breathing easily. Normal affect. In a pleasant mood. Head: Normocephalic, atraumatic. No masses, or scars. Eyes: Pupils are equal, and reactive to light. Vision is grossly intact. No spontaneous or gaze nystagmus. Ears: Ear canals are clear. Tympanic membranes are intact, with normal landmarks and the middle ears are clear and healthy. Hearing: Grossly normal. Nose: Nasal cavities are clear with healthy mucosa, no polyps or exudate. Airways are  patent. Face: No masses or scars, facial nerve function is symmetric. Oral Cavity: No mucosal abnormalities are noted. Tongue with normal mobility. Dentition appears healthy. Oropharynx: Tonsils are symmetric. There are no mucosal masses identified. Tongue base appears normal and healthy. Larynx/Hypopharynx: deferred Chest: Deferred Neck:'s a 2 cm rubbery mass in the superficial aspect of the lower parotid overlying the angle of the mandible. No other palpable masses, no cervical adenopathy, no thyroid nodules or enlargement. Neuro: Cranial nerves II-XII with normal function. She does have tremor involving the cervical muscles and her voice. Balance: Normal  gate. Other findings: none.  Independent Review of Additional Tests or Records:  none  Procedures:  none  Impression & Plans:  Right side parotid mass. Recommend surgical removal of this. We discussed that 90% of these are benign but over time can transform into malignancy. Surgery is much easier when they are small. Risks and benefits were discussed including gustatory sweating, the incision and scar, the need for drain, overnight stay, facial nerve injuries, numbness of the ear, wound infections. All questions were answered. She would like to schedule in early November.

## 2022-08-20 ENCOUNTER — Observation Stay (HOSPITAL_BASED_OUTPATIENT_CLINIC_OR_DEPARTMENT_OTHER)
Admission: RE | Admit: 2022-08-20 | Discharge: 2022-08-21 | Disposition: A | Discharge status: Home / Self Care | Payer: 59 | Attending: Otolaryngology | Admitting: Otolaryngology

## 2022-08-20 ENCOUNTER — Other Ambulatory Visit (HOSPITAL_COMMUNITY): Payer: Self-pay

## 2022-08-20 ENCOUNTER — Other Ambulatory Visit: Payer: Self-pay

## 2022-08-20 ENCOUNTER — Ambulatory Visit (HOSPITAL_BASED_OUTPATIENT_CLINIC_OR_DEPARTMENT_OTHER): Payer: 59 | Admitting: Certified Registered"

## 2022-08-20 ENCOUNTER — Encounter (HOSPITAL_BASED_OUTPATIENT_CLINIC_OR_DEPARTMENT_OTHER): Payer: Self-pay | Admitting: Otolaryngology

## 2022-08-20 ENCOUNTER — Encounter (HOSPITAL_BASED_OUTPATIENT_CLINIC_OR_DEPARTMENT_OTHER)
Admission: RE | Disposition: A | Discharge status: Home / Self Care | Payer: Self-pay | Source: Home / Self Care | Attending: Otolaryngology

## 2022-08-20 DIAGNOSIS — K118 Other diseases of salivary glands: Secondary | ICD-10-CM | POA: Diagnosis present

## 2022-08-20 DIAGNOSIS — D110 Benign neoplasm of parotid gland: Principal | ICD-10-CM | POA: Insufficient documentation

## 2022-08-20 DIAGNOSIS — Z79899 Other long term (current) drug therapy: Secondary | ICD-10-CM | POA: Insufficient documentation

## 2022-08-20 DIAGNOSIS — Z1211 Encounter for screening for malignant neoplasm of colon: Secondary | ICD-10-CM

## 2022-08-20 DIAGNOSIS — Z01818 Encounter for other preprocedural examination: Secondary | ICD-10-CM

## 2022-08-20 HISTORY — PX: PAROTIDECTOMY: SHX2163

## 2022-08-20 HISTORY — DX: Gastro-esophageal reflux disease without esophagitis: K21.9

## 2022-08-20 SURGERY — EXCISION, PAROTID GLAND
Anesthesia: General | Site: Neck | Laterality: Right

## 2022-08-20 MED ORDER — FENTANYL CITRATE (PF) 100 MCG/2ML IJ SOLN
INTRAMUSCULAR | Status: AC
  Filled 2022-08-20: qty 2

## 2022-08-20 MED ORDER — AMISULPRIDE (ANTIEMETIC) 5 MG/2ML IV SOLN: 10.0000 mg | Freq: Once | INTRAVENOUS | Status: DC | PRN

## 2022-08-20 MED ORDER — PROPOFOL 10 MG/ML IV BOLUS
INTRAVENOUS | Status: AC
  Filled 2022-08-20: qty 20

## 2022-08-20 MED ORDER — ACETAMINOPHEN 10 MG/ML IV SOLN
INTRAVENOUS | Status: AC
  Filled 2022-08-20: qty 100

## 2022-08-20 MED ORDER — DEXTROSE-NACL 5-0.9 % IV SOLN: INTRAVENOUS | Status: DC

## 2022-08-20 MED ORDER — EPHEDRINE SULFATE (PRESSORS) 50 MG/ML IJ SOLN
INTRAMUSCULAR | Status: DC | PRN
  Administered 2022-08-20 (×2): 5 mg via INTRAVENOUS

## 2022-08-20 MED ORDER — PROPRANOLOL HCL 10 MG PO TABS
10.0000 mg | ORAL_TABLET | Freq: Three times a day (TID) | ORAL | Status: DC
  Filled 2022-08-20 (×2): qty 1

## 2022-08-20 MED ORDER — PROPRANOLOL HCL ER 60 MG PO CP24
60.0000 mg | ORAL_CAPSULE | Freq: Every day | ORAL | Status: DC
  Filled 2022-08-20: qty 1

## 2022-08-20 MED ORDER — PANTOPRAZOLE SODIUM 40 MG PO TBEC
40.0000 mg | DELAYED_RELEASE_TABLET | Freq: Every day | ORAL | Status: DC
  Filled 2022-08-20: qty 1

## 2022-08-20 MED ORDER — PROMETHAZINE HCL 25 MG/ML IJ SOLN: 6.2500 mg | INTRAMUSCULAR | Status: DC | PRN

## 2022-08-20 MED ORDER — ACETAMINOPHEN 160 MG/5ML PO SOLN: 325.0000 mg | ORAL | Status: DC | PRN

## 2022-08-20 MED ORDER — EPHEDRINE 5 MG/ML INJ
INTRAVENOUS | Status: AC
  Filled 2022-08-20: qty 5

## 2022-08-20 MED ORDER — 0.9 % SODIUM CHLORIDE (POUR BTL) OPTIME
TOPICAL | Status: DC | PRN
  Administered 2022-08-20: 200 mL

## 2022-08-20 MED ORDER — IBUPROFEN 100 MG/5ML PO SUSP
400.0000 mg | Freq: Four times a day (QID) | ORAL | Status: DC | PRN
  Administered 2022-08-20: 400 mg via ORAL
  Filled 2022-08-20: qty 20

## 2022-08-20 MED ORDER — FENTANYL CITRATE (PF) 100 MCG/2ML IJ SOLN: 25.0000 ug | INTRAMUSCULAR | Status: DC | PRN

## 2022-08-20 MED ORDER — ONDANSETRON HCL 4 MG/2ML IJ SOLN: 4.0000 mg | INTRAMUSCULAR | Status: DC | PRN

## 2022-08-20 MED ORDER — OXYCODONE HCL 5 MG PO TABS: 5.0000 mg | ORAL_TABLET | Freq: Once | ORAL | Status: DC | PRN

## 2022-08-20 MED ORDER — SUCCINYLCHOLINE CHLORIDE 200 MG/10ML IV SOSY
PREFILLED_SYRINGE | INTRAVENOUS | Status: AC
  Filled 2022-08-20: qty 10

## 2022-08-20 MED ORDER — B COMPLEX-C PO TABS: 1.0000 | ORAL_TABLET | Freq: Every day | ORAL | Status: DC

## 2022-08-20 MED ORDER — ONDANSETRON HCL 4 MG PO TABS: 4.0000 mg | ORAL_TABLET | ORAL | Status: DC | PRN

## 2022-08-20 MED ORDER — PROPOFOL 10 MG/ML IV BOLUS
INTRAVENOUS | Status: DC | PRN
  Administered 2022-08-20 (×3): 50 mg via INTRAVENOUS
  Administered 2022-08-20: 150 mg via INTRAVENOUS

## 2022-08-20 MED ORDER — LIDOCAINE-EPINEPHRINE 1 %-1:100000 IJ SOLN
INTRAMUSCULAR | Status: AC
  Filled 2022-08-20: qty 1

## 2022-08-20 MED ORDER — LIDOCAINE 2% (20 MG/ML) 5 ML SYRINGE
INTRAMUSCULAR | Status: DC | PRN
  Administered 2022-08-20: 40 mg via INTRAVENOUS

## 2022-08-20 MED ORDER — LACTATED RINGERS IV SOLN: INTRAVENOUS | Status: DC

## 2022-08-20 MED ORDER — ONDANSETRON 4 MG PO TBDP
4.0000 mg | ORAL_TABLET | Freq: Three times a day (TID) | ORAL | 0 refills | Status: DC | PRN
  Filled 2022-08-20: qty 20, 7d supply, fill #0

## 2022-08-20 MED ORDER — ONDANSETRON HCL 4 MG/2ML IJ SOLN
INTRAMUSCULAR | Status: DC | PRN
  Administered 2022-08-20: 4 mg via INTRAVENOUS

## 2022-08-20 MED ORDER — CITALOPRAM HYDROBROMIDE 20 MG PO TABS
20.0000 mg | ORAL_TABLET | Freq: Every day | ORAL | Status: DC
  Filled 2022-08-20: qty 1

## 2022-08-20 MED ORDER — HYDROCODONE-ACETAMINOPHEN 7.5-325 MG PO TABS
1.0000 | ORAL_TABLET | Freq: Four times a day (QID) | ORAL | 0 refills | Status: DC | PRN
  Filled 2022-08-20: qty 20, 5d supply, fill #0

## 2022-08-20 MED ORDER — ACETAMINOPHEN 325 MG PO TABS
ORAL_TABLET | ORAL | Status: AC
  Filled 2022-08-20: qty 2

## 2022-08-20 MED ORDER — MIDAZOLAM HCL 2 MG/2ML IJ SOLN
INTRAMUSCULAR | Status: AC
  Filled 2022-08-20: qty 2

## 2022-08-20 MED ORDER — GLYCOPYRROLATE 0.2 MG/ML IJ SOLN
INTRAMUSCULAR | Status: DC | PRN
  Administered 2022-08-20: .2 mg via INTRAVENOUS

## 2022-08-20 MED ORDER — SUCCINYLCHOLINE CHLORIDE 200 MG/10ML IV SOSY
PREFILLED_SYRINGE | INTRAVENOUS | Status: DC | PRN
  Administered 2022-08-20: 120 mg via INTRAVENOUS

## 2022-08-20 MED ORDER — CEFAZOLIN SODIUM 1 G IJ SOLR
INTRAMUSCULAR | Status: AC
  Filled 2022-08-20: qty 10

## 2022-08-20 MED ORDER — FENTANYL CITRATE (PF) 100 MCG/2ML IJ SOLN
INTRAMUSCULAR | Status: DC | PRN
  Administered 2022-08-20 (×4): 50 ug via INTRAVENOUS

## 2022-08-20 MED ORDER — LIDOCAINE-EPINEPHRINE 1 %-1:100000 IJ SOLN
INTRAMUSCULAR | Status: DC | PRN
  Administered 2022-08-20: 3 mL

## 2022-08-20 MED ORDER — SODIUM CHLORIDE (PF) 0.9 % IJ SOLN
INTRAMUSCULAR | Status: AC
  Filled 2022-08-20: qty 10

## 2022-08-20 MED ORDER — LEVOTHYROXINE SODIUM 125 MCG PO TABS
125.0000 ug | ORAL_TABLET | Freq: Every day | ORAL | Status: DC
  Filled 2022-08-20: qty 1

## 2022-08-20 MED ORDER — DEXAMETHASONE SODIUM PHOSPHATE 10 MG/ML IJ SOLN
INTRAMUSCULAR | Status: DC | PRN
  Administered 2022-08-20: 10 mg via INTRAVENOUS

## 2022-08-20 MED ORDER — LIDOCAINE 2% (20 MG/ML) 5 ML SYRINGE
INTRAMUSCULAR | Status: AC
  Filled 2022-08-20: qty 5

## 2022-08-20 MED ORDER — ACETAMINOPHEN 325 MG PO TABS: 325.0000 mg | ORAL_TABLET | ORAL | Status: DC | PRN

## 2022-08-20 MED ORDER — ACETAMINOPHEN 10 MG/ML IV SOLN
1000.0000 mg | Freq: Once | INTRAVENOUS | Status: DC | PRN
  Administered 2022-08-20: 1000 mg via INTRAVENOUS

## 2022-08-20 MED ORDER — HYDROCODONE-ACETAMINOPHEN 5-325 MG PO TABS: 1.0000 | ORAL_TABLET | ORAL | Status: DC | PRN

## 2022-08-20 MED ORDER — ATROPINE SULFATE 0.4 MG/ML IV SOLN
INTRAVENOUS | Status: AC
  Filled 2022-08-20: qty 1

## 2022-08-20 MED ORDER — OXYCODONE HCL 5 MG/5ML PO SOLN: 5.0000 mg | Freq: Once | ORAL | Status: DC | PRN

## 2022-08-20 MED ORDER — MIDAZOLAM HCL 5 MG/5ML IJ SOLN
INTRAMUSCULAR | Status: DC | PRN
  Administered 2022-08-20: 2 mg via INTRAVENOUS

## 2022-08-20 MED ORDER — BACITRACIN ZINC 500 UNIT/GM EX OINT
TOPICAL_OINTMENT | CUTANEOUS | Status: AC
  Filled 2022-08-20: qty 0.9

## 2022-08-20 SURGICAL SUPPLY — 66 items
ADH SKN CLS APL DERMABOND .7 (GAUZE/BANDAGES/DRESSINGS)
APL SKNCLS STERI-STRIP NONHPOA (GAUZE/BANDAGES/DRESSINGS)
APL SWBSTK 6 STRL LF DISP (MISCELLANEOUS) ×2
APPLICATOR COTTON TIP 6 STRL (MISCELLANEOUS) ×1 IMPLANT
APPLICATOR COTTON TIP 6IN STRL (MISCELLANEOUS) ×2
ATTRACTOMAT 16X20 MAGNETIC DRP (DRAPES) IMPLANT
BENZOIN TINCTURE PRP APPL 2/3 (GAUZE/BANDAGES/DRESSINGS) IMPLANT
BLADE SURG 15 STRL LF DISP TIS (BLADE) ×1 IMPLANT
BLADE SURG 15 STRL SS (BLADE) ×1
CANISTER SUCT 1200ML W/VALVE (MISCELLANEOUS) ×1 IMPLANT
CLEANER CAUTERY TIP 5X5 PAD (MISCELLANEOUS) ×1 IMPLANT
CORD BIPOLAR FORCEPS 12FT (ELECTRODE) ×1 IMPLANT
COVER BACK TABLE 60X90IN (DRAPES) ×1 IMPLANT
COVER MAYO STAND STRL (DRAPES) ×1 IMPLANT
DERMABOND ADVANCED .7 DNX12 (GAUZE/BANDAGES/DRESSINGS) IMPLANT
DRAIN JACKSON RD 7FR 3/32 (WOUND CARE) IMPLANT
DRAPE INCISE 23X17 IOBAN STRL (DRAPES) ×1
DRAPE INCISE 23X17 STRL (DRAPES) ×1 IMPLANT
DRAPE INCISE IOBAN 23X17 STRL (DRAPES) ×1 IMPLANT
DRAPE U-SHAPE 76X120 STRL (DRAPES) ×1 IMPLANT
ELECT COATED BLADE 2.86 ST (ELECTRODE) ×1 IMPLANT
ELECT REM PT RETURN 9FT ADLT (ELECTROSURGICAL) ×1
ELECTRODE REM PT RTRN 9FT ADLT (ELECTROSURGICAL) ×1 IMPLANT
EVACUATOR SILICONE 100CC (DRAIN) IMPLANT
FORCEPS BIPOLAR SPETZLER 8 1.0 (NEUROSURGERY SUPPLIES) ×1 IMPLANT
GAUZE 4X4 16PLY ~~LOC~~+RFID DBL (SPONGE) IMPLANT
GAUZE SPONGE 4X4 12PLY STRL LF (GAUZE/BANDAGES/DRESSINGS) IMPLANT
GLOVE BIO SURGEON STRL SZ 6.5 (GLOVE) IMPLANT
GLOVE BIO SURGEON STRL SZ7 (GLOVE) IMPLANT
GLOVE BIO SURGEON STRL SZ7.5 (GLOVE) IMPLANT
GLOVE BIOGEL PI IND STRL 8 (GLOVE) IMPLANT
GLOVE ECLIPSE 7.5 STRL STRAW (GLOVE) ×1 IMPLANT
GLOVE ECLIPSE 8.0 STRL XLNG CF (GLOVE) IMPLANT
GLOVE SS BIOGEL STRL SZ 7.5 (GLOVE) IMPLANT
GOWN STRL REUS W/ TWL LRG LVL3 (GOWN DISPOSABLE) ×1 IMPLANT
GOWN STRL REUS W/ TWL XL LVL3 (GOWN DISPOSABLE) ×1 IMPLANT
GOWN STRL REUS W/TWL LRG LVL3 (GOWN DISPOSABLE) ×2
GOWN STRL REUS W/TWL XL LVL3 (GOWN DISPOSABLE) ×1
LOCATOR NERVE 3 VOLT (DISPOSABLE) IMPLANT
NDL HYPO 25X1 1.5 SAFETY (NEEDLE) IMPLANT
NDL HYPO 27GX1-1/4 (NEEDLE) IMPLANT
NEEDLE HYPO 25X1 1.5 SAFETY (NEEDLE) IMPLANT
NEEDLE HYPO 27GX1-1/4 (NEEDLE) ×1 IMPLANT
NS IRRIG 1000ML POUR BTL (IV SOLUTION) ×1 IMPLANT
PACK BASIN DAY SURGERY FS (CUSTOM PROCEDURE TRAY) ×1 IMPLANT
PENCIL FOOT CONTROL (ELECTRODE) ×1 IMPLANT
PIN SAFETY STERILE (MISCELLANEOUS) IMPLANT
SHEARS HARMONIC 9CM CVD (BLADE) ×1 IMPLANT
SHEET MEDIUM DRAPE 40X70 STRL (DRAPES) IMPLANT
SLEEVE SCD COMPRESS KNEE MED (STOCKING) IMPLANT
STAPLER VISISTAT 35W (STAPLE) IMPLANT
STRIP CLOSURE SKIN 1/2X4 (GAUZE/BANDAGES/DRESSINGS) IMPLANT
SUCTION FRAZIER HANDLE 10FR (MISCELLANEOUS)
SUCTION TUBE FRAZIER 10FR DISP (MISCELLANEOUS) IMPLANT
SUT CHROMIC 3 0 PS 2 (SUTURE) ×1 IMPLANT
SUT ETHILON 3 0 PS 1 (SUTURE) ×1 IMPLANT
SUT ETHILON 5 0 P 3 18 (SUTURE)
SUT NYLON ETHILON 5-0 P-3 1X18 (SUTURE) IMPLANT
SUT PLAIN 5 0 P 3 18 (SUTURE) IMPLANT
SUT SILK 3 0 PS 1 (SUTURE) ×1 IMPLANT
SUT SILK 3 0 SH CR/8 (SUTURE) IMPLANT
SUT SILK 4 0 TIES 17X18 (SUTURE) ×1 IMPLANT
SYR BULB EAR ULCER 3OZ GRN STR (SYRINGE) ×1 IMPLANT
SYR CONTROL 10ML LL (SYRINGE) IMPLANT
TOWEL GREEN STERILE FF (TOWEL DISPOSABLE) ×2 IMPLANT
TUBE CONNECTING 20X1/4 (TUBING) ×1 IMPLANT

## 2022-08-20 NOTE — Discharge Instructions (Addendum)
Empty drain, and recharge 3 times daily.  Tape right eye closed when sleeping and use lubricating eye drops as needed.   Post Anesthesia Home Care Instructions  Activity: Get plenty of rest for the remainder of the day. A responsible individual must stay with you for 24 hours following the procedure.  For the next 24 hours, DO NOT: -Drive a car -Paediatric nurse -Drink alcoholic beverages -Take any medication unless instructed by your physician -Make any legal decisions or sign important papers.  Meals: Start with liquid foods such as gelatin or soup. Progress to regular foods as tolerated. Avoid greasy, spicy, heavy foods. If nausea and/or vomiting occur, drink only clear liquids until the nausea and/or vomiting subsides. Call your physician if vomiting continues.  Special Instructions/Symptoms: Your throat may feel dry or sore from the anesthesia or the breathing tube placed in your throat during surgery. If this causes discomfort, gargle with warm salt water. The discomfort should disappear within 24 hours.

## 2022-08-20 NOTE — Op Note (Signed)
OPERATIVE REPORT  DATE OF SURGERY: 08/20/2022  PATIENT:  Katherine Garrett,  56 y.o. female  PRE-OPERATIVE DIAGNOSIS:  Parotid mass  Garrett-OPERATIVE DIAGNOSIS:  Parotid mass  PROCEDURE:  Procedure(s): SUPERFICIAL PAROTIDECTOMY  SURGEON:  Beckie Salts, MD  ASSISTANTS: RNFA  ANESTHESIA:   General   EBL: 20 ml  DRAINS: 7 French round JP  LOCAL MEDICATIONS USED:  None  SPECIMEN: Right parotid, superficial lobe with associated tumor  COUNTS:  Correct  PROCEDURE DETAILS: The patient was taken to the operating room and placed on the operating table in the supine position. Following induction of general endotracheal anesthesia, the right side of the face was prepped and draped in a standard fashion. A preauricular incision was outlined with a marking pen with extension down around the mastoid process and 2 fingerbreadths below the angle of the mandible.  Electrocautery was used to incise the skin and subcutaneous tissue. The lateral branch of the greater auricular nerve was preserved. The parotid gland was dissected forward off of the upper sternocleidomastoid muscle and the digastric muscle was exposed posteriorly. The gland was also dissected off of the external auditory canal. Careful dissection superior to the digastric muscle and medial to the tympanomastoid suture line revealed the main trunk of the facial nerve. Using a McCabe dissector the main trunk was dissected out towards the pes anserinus. The upper and lower divisions were then dissected identifying all branches.  The harmonic scalpel was used to divide the parotid tissue. Bipolar cautery was used as needed for completion of hemostasis. The tumor was identified in the anterior part of the gland. The glandular tissue with the accompanying tumor was dissected free and removed, sent for pathologic evaluation. The wound was irrigated with saline and hemostasis was completed as needed.  The drain was exited through a separate stab  incision and secured in place with nylon suture. A running subcuticular chromic closure was accomplished and Dermabond was used on the skin. The drain was charged. She was awakened, extubated and transferred to recovery in stable condition.    PATIENT DISPOSITION:  To PACU, stable

## 2022-08-20 NOTE — Anesthesia Procedure Notes (Signed)
Procedure Name: Intubation Date/Time: 08/20/2022 7:41 AM  Performed by: Lavonia Elta, CRNAPre-anesthesia Checklist: Patient identified, Emergency Drugs available, Suction available and Patient being monitored Patient Re-evaluated:Patient Re-evaluated prior to induction Oxygen Delivery Method: Circle system utilized Preoxygenation: Pre-oxygenation with 100% oxygen Induction Type: IV induction Ventilation: Mask ventilation without difficulty Laryngoscope Size: Mac and 3 Grade View: Grade I Tube type: Oral Tube size: 7.0 mm Number of attempts: 1 Airway Equipment and Method: Stylet and Bite block Placement Confirmation: ETT inserted through vocal cords under direct vision, positive ETCO2 and breath sounds checked- equal and bilateral Secured at: 22 cm Tube secured with: Tape Dental Injury: Teeth and Oropharynx as per pre-operative assessment

## 2022-08-20 NOTE — Anesthesia Postprocedure Evaluation (Signed)
Anesthesia Post Note  Patient: Katherine Garrett  Procedure(s) Performed: SUPERFICIAL PAROTIDECTOMY (Right: Neck)     Patient location during evaluation: PACU Anesthesia Type: General Level of consciousness: awake and alert Pain management: pain level controlled Vital Signs Assessment: post-procedure vital signs reviewed and stable Respiratory status: spontaneous breathing, nonlabored ventilation, respiratory function stable and patient connected to nasal cannula oxygen Cardiovascular status: blood pressure returned to baseline and stable Postop Assessment: no apparent nausea or vomiting Anesthetic complications: no   No notable events documented.  Last Vitals:  Vitals:   08/20/22 0945 08/20/22 1015  BP:  123/83  Pulse: 63 66  Resp: 15 16  Temp:  37 C  SpO2: 91% 94%    Last Pain:  Vitals:   08/20/22 1015  TempSrc:   PainSc: 3                  Effie Berkshire

## 2022-08-20 NOTE — Interval H&P Note (Signed)
History and Physical Interval Note:  08/20/2022 7:11 AM  Katherine Garrett  has presented today for surgery, with the diagnosis of Parotid mass.  The various methods of treatment have been discussed with the patient and family. After consideration of risks, benefits and other options for treatment, the patient has consented to  Procedure(s): SUPERFICIAL PAROTIDECTOMY (Right) as a surgical intervention.  The patient's history has been reviewed, patient examined, no change in status, stable for surgery.  I have reviewed the patient's chart and labs.  Questions were answered to the patient's satisfaction.     Izora Gala

## 2022-08-20 NOTE — Anesthesia Preprocedure Evaluation (Addendum)
Anesthesia Evaluation  Patient identified by MRN, date of birth, ID band Patient awake    Reviewed: Allergy & Precautions, NPO status , Patient's Chart, lab work & pertinent test results  Airway Mallampati: I  TM Distance: >3 FB Neck ROM: Full    Dental  (+) Teeth Intact, Chipped,    Pulmonary neg pulmonary ROS   breath sounds clear to auscultation       Cardiovascular negative cardio ROS  Rhythm:Regular Rate:Normal     Neuro/Psych  Headaches PSYCHIATRIC DISORDERS Anxiety Depression       GI/Hepatic Neg liver ROS,GERD  Medicated,,  Endo/Other  Hypothyroidism    Renal/GU negative Renal ROS     Musculoskeletal negative musculoskeletal ROS (+)    Abdominal   Peds  Hematology negative hematology ROS (+)   Anesthesia Other Findings   Reproductive/Obstetrics                             Anesthesia Physical Anesthesia Plan  ASA: 2  Anesthesia Plan: General   Post-op Pain Management: Minimal or no pain anticipated   Induction: Intravenous  PONV Risk Score and Plan:   Airway Management Planned: Oral ETT  Additional Equipment: None  Intra-op Plan:   Post-operative Plan: Extubation in OR  Informed Consent: I have reviewed the patients History and Physical, chart, labs and discussed the procedure including the risks, benefits and alternatives for the proposed anesthesia with the patient or authorized representative who has indicated his/her understanding and acceptance.     Dental advisory given  Plan Discussed with: CRNA  Anesthesia Plan Comments:        Anesthesia Quick Evaluation

## 2022-08-20 NOTE — Transfer of Care (Signed)
Immediate Anesthesia Transfer of Care Note  Patient: Eulas Post  Procedure(s) Performed: SUPERFICIAL PAROTIDECTOMY (Right: Neck)  Patient Location: PACU  Anesthesia Type:General  Level of Consciousness: awake, alert , and oriented  Airway & Oxygen Therapy: Patient Spontanous Breathing and Patient connected to face mask oxygen  Post-op Assessment: Report given to RN and Post -op Vital signs reviewed and stable  Post vital signs: Reviewed and stable  Last Vitals:  Vitals Value Taken Time  BP 117/64 08/20/22 0917  Temp    Pulse 77 08/20/22 0917  Resp    SpO2 94 % 08/20/22 0917  Vitals shown include unvalidated device data.  Last Pain:  Vitals:   08/20/22 0627  TempSrc: Oral  PainSc: 0-No pain      Patients Stated Pain Goal: 6 (57/89/78 4784)  Complications: No notable events documented.

## 2022-08-21 ENCOUNTER — Encounter (HOSPITAL_BASED_OUTPATIENT_CLINIC_OR_DEPARTMENT_OTHER): Payer: Self-pay | Admitting: Otolaryngology

## 2022-08-21 ENCOUNTER — Other Ambulatory Visit (HOSPITAL_COMMUNITY): Payer: Self-pay

## 2022-08-21 LAB — SURGICAL PATHOLOGY

## 2022-08-21 NOTE — Discharge Summary (Signed)
Physician Discharge Summary  Patient ID: SHERRON MAPP MRN: [id number] DOB/AGE: 58/13/1967 58 y.o.  Admit date: 08/20/2022 Discharge date: 08/21/2022  Admission Diagnoses: Parotid mass  Discharge Diagnoses:  Principal Problem:   Parotid mass   Discharged Condition: good  Hospital Course: No complications  Consults: none  Significant Diagnostic Studies: none  Treatments: surgery: Parotidectomy with facial nerve dissection  Discharge Exam: Blood pressure 106/60, pulse (!) 56, temperature 98.7 F (37.1 C), resp. rate 16, height '5\' 5"'$  (1.651 m), weight 82.4 kg, last menstrual period 04/11/2017, SpO2 95 %. PHYSICAL EXAM: Incision looks great.  Diffuse facial nerve weakness throughout all branches.  Slight movement of the forehead.  Slight movement of the middle branches.  Minimal movement of the lower branch.  Disposition: Discharge disposition: 01-Home or Self Care       Discharge Instructions     Diet - low sodium heart healthy   Complete by: As directed    Discharge wound care:   Complete by: As directed    Keep the wound clean and dry.  When the drain comes out you may shower.  You may use soap and water and shampoo but do not use any oils creams or ointments on the glue for 1 week.  After 1 week you may use Vaseline twice daily if you want the glue to come off faster.   Increase activity slowly   Complete by: As directed       Allergies as of 08/21/2022       Reactions   Codeine Nausea And Vomiting, Anxiety, Other (See Comments)   Topiramate Other (See Comments), Nausea And Vomiting   Bone pain  Bone pain    Primidone Nausea And Vomiting   Bone pain  Bone pain         Medication List     TAKE these medications    albuterol 108 (90 Base) MCG/ACT inhaler Commonly known as: VENTOLIN HFA INHALE 2 PUFFS INTO THE LUNGS EVERY 6 HOURS AS NEEDED FOR WHEEZING OR SHORTNESS OF BREATH.   b complex vitamins capsule Take 1 capsule by mouth daily.    citalopram 20 MG tablet Commonly known as: CELEXA Take 1 tablet (20 mg total) by mouth daily.   diazepam 2 MG tablet Commonly known as: VALIUM Take 1 tablet (2 mg total) by mouth every 12 (twelve) hours as needed.   esomeprazole 40 MG capsule Commonly known as: NEXIUM TAKE 1 CAPSULE BY MOUTH ONCE A DAY AT 12 NOON   HYDROcodone-acetaminophen 7.5-325 MG tablet Commonly known as: Norco Take 1 tablet by mouth every 6 (six) hours as needed for moderate pain.   ketoconazole 2 % cream Commonly known as: NIZORAL Apply 1 application topically 2 (two) times daily.   MIRALAX PO Miralax   ondansetron 4 MG disintegrating tablet Commonly known as: ZOFRAN-ODT Take 1 tablet (4 mg total) by mouth every 8 (eight) hours as needed for nausea or vomiting.   propranolol ER 60 MG 24 hr capsule Commonly known as: INDERAL LA Take 1 capsule (60 mg total) by mouth daily.   propranolol 10 MG tablet Commonly known as: INDERAL Take 1 tablet (10 mg total) by mouth 3 (three) times daily.   Synthroid 125 MCG tablet Generic drug: levothyroxine TAKE 1 TABLET BY MOUTH ONCE DAILY BEFORE BREAKFAST   triamcinolone cream 0.1 % Commonly known as: KENALOG Apply 1 application topically 2 (two) times daily.   VITAMIN D PO Take by mouth.  Discharge Care Instructions  (From admission, onward)           Start     Ordered   08/21/22 0000  Discharge wound care:       Comments: Keep the wound clean and dry.  When the drain comes out you may shower.  You may use soap and water and shampoo but do not use any oils creams or ointments on the glue for 1 week.  After 1 week you may use Vaseline twice daily if you want the glue to come off faster.   08/21/22 1610            Follow-up Information     Izora Gala, MD Follow up on 08/22/2022.   Specialty: Otolaryngology Why: come to the office at 11:00 am for drain removal Contact information: [address] [place] [address] [phone]                 Signed: Izora Gala 08/21/2022, 8:28 AM

## 2022-08-27 ENCOUNTER — Other Ambulatory Visit (HOSPITAL_COMMUNITY): Payer: Self-pay

## 2022-08-27 ENCOUNTER — Other Ambulatory Visit: Payer: Self-pay | Admitting: Family

## 2022-08-27 DIAGNOSIS — R498 Other voice and resonance disorders: Secondary | ICD-10-CM

## 2022-08-27 MED ORDER — PROPRANOLOL HCL ER 60 MG PO CP24
60.0000 mg | ORAL_CAPSULE | Freq: Every day | ORAL | 0 refills | Status: DC
  Filled 2022-08-27 – 2022-08-29 (×2): qty 90, 90d supply, fill #0

## 2022-08-27 MED ORDER — CITALOPRAM HYDROBROMIDE 20 MG PO TABS
20.0000 mg | ORAL_TABLET | Freq: Every day | ORAL | 0 refills | Status: DC
  Filled 2022-08-27 – 2022-08-29 (×2): qty 90, 90d supply, fill #0

## 2022-08-27 MED ORDER — ESOMEPRAZOLE MAGNESIUM 40 MG PO CPDR
40.0000 mg | DELAYED_RELEASE_CAPSULE | Freq: Every day | ORAL | 0 refills | Status: DC
  Filled 2022-08-27: qty 90, 90d supply, fill #0

## 2022-08-27 NOTE — Telephone Encounter (Signed)
Requesting: Valium  Contract: N/A  UDS: N/A Last Visit: 01/30/2022 Next Visit: N/A Last Refill: 07/24/2022  Please Advise

## 2022-08-28 ENCOUNTER — Other Ambulatory Visit (HOSPITAL_COMMUNITY): Payer: Self-pay

## 2022-08-28 MED ORDER — DIAZEPAM 2 MG PO TABS
2.0000 mg | ORAL_TABLET | Freq: Two times a day (BID) | ORAL | 0 refills | Status: DC | PRN
  Filled 2022-08-28: qty 60, 30d supply, fill #0

## 2022-08-29 ENCOUNTER — Other Ambulatory Visit: Payer: Self-pay

## 2022-08-30 ENCOUNTER — Other Ambulatory Visit: Payer: Self-pay

## 2022-09-16 ENCOUNTER — Encounter: Payer: Self-pay | Admitting: Internal Medicine

## 2022-09-24 IMAGING — CT CT NECK W/ CM
3 of 4 series · 12 of 33 positions shown, 14 images · IV contrast (Omnipaque)
Comparison: None.

CLINICAL DATA: 56-year-old female ?neck mass? .

EXAM:
CT NECK WITH CONTRAST
TECHNIQUE: Multidetector CT imaging of the neck was performed using the
standard protocol following the bolus administration of intravenous
contrast.

[Series 6: sag neck · sagittal · 0.41mm/px · 5 of 124 slices shown, 6 images]
[im 42/124  bone]
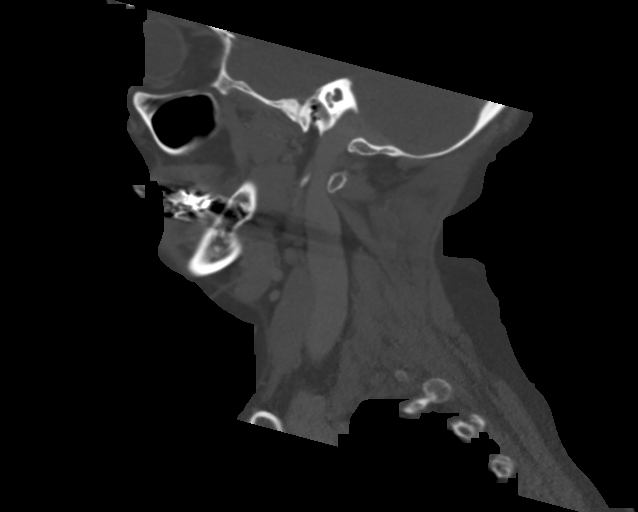
[im 52/124  bone]
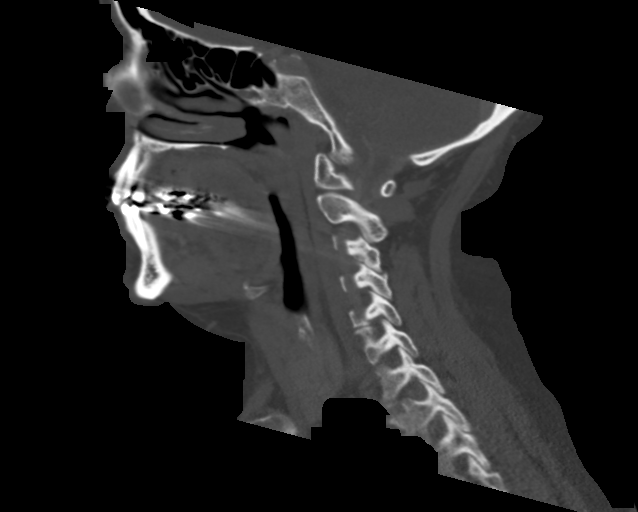
[im 62/124  soft-tissue]
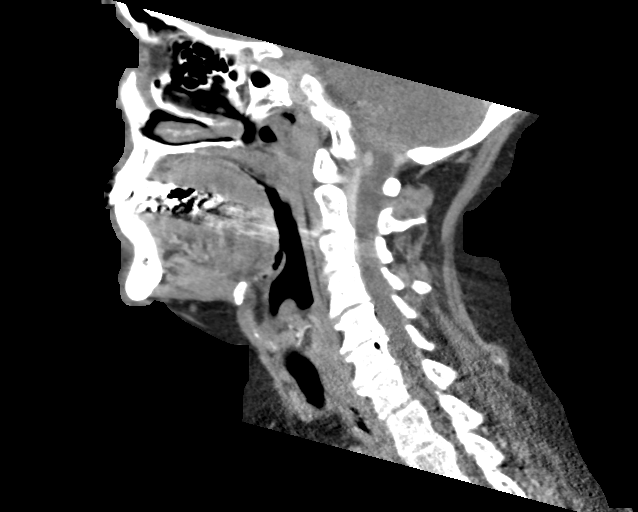
[im 62/124  bone]
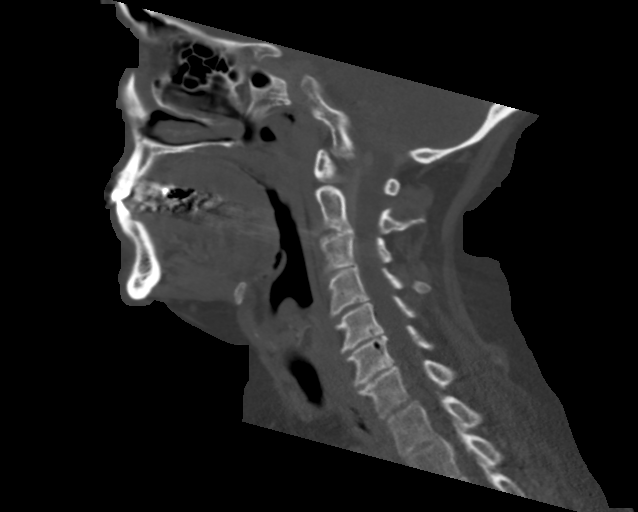
[im 72/124  bone]
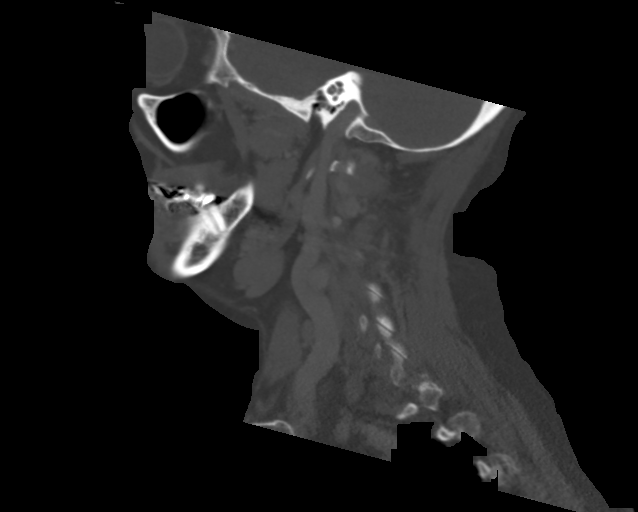
[im 83/124  bone]
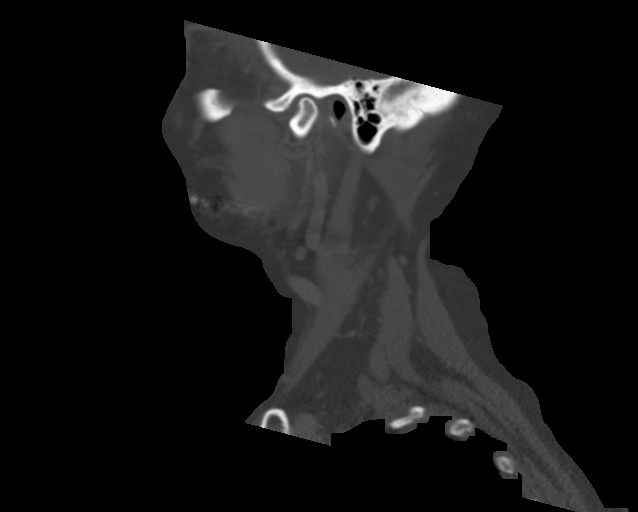

[Series 7: cor neck · coronal · 0.41mm/px · 3 of 132 slices shown]
[im 38/132  bone]
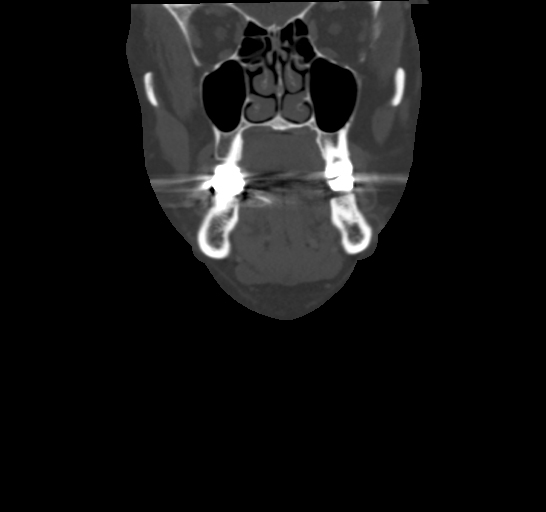
[im 57/132  bone]
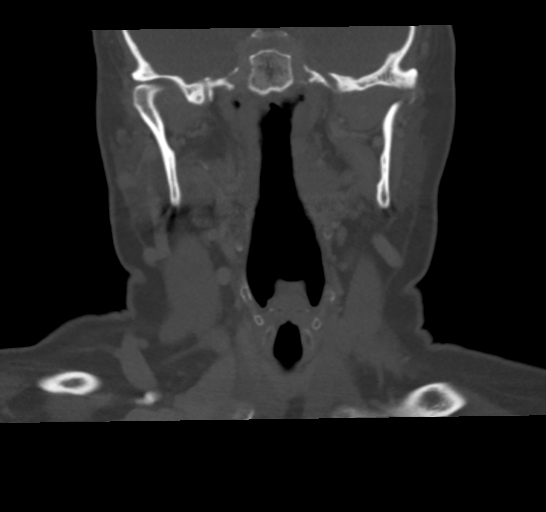
[im 76/132  bone]
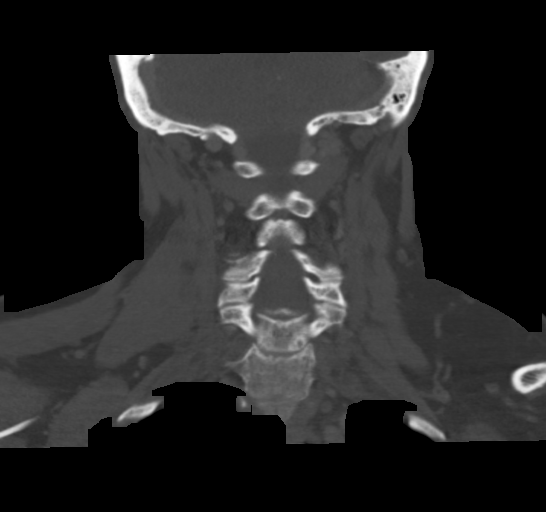

[Series 8: ax oropharynx · axial · 0.28mm/px · z∈[-177,-34]mm · 4 of 111 slices shown, 5 images]
[im 16/111  soft-tissue]
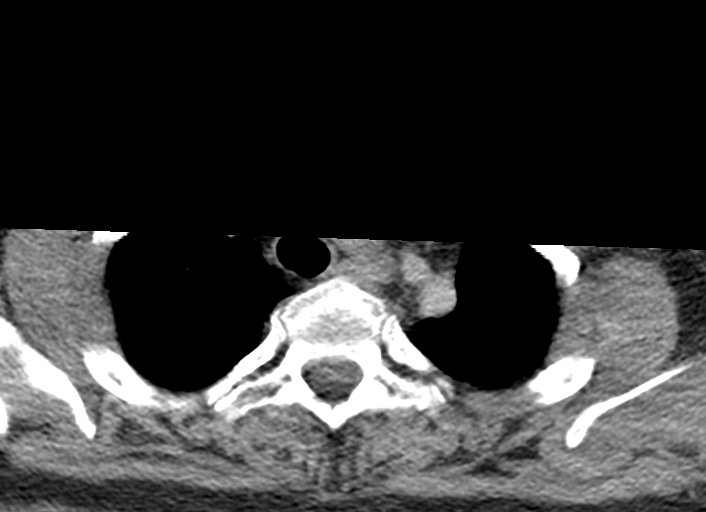
[im 16/111  bone]
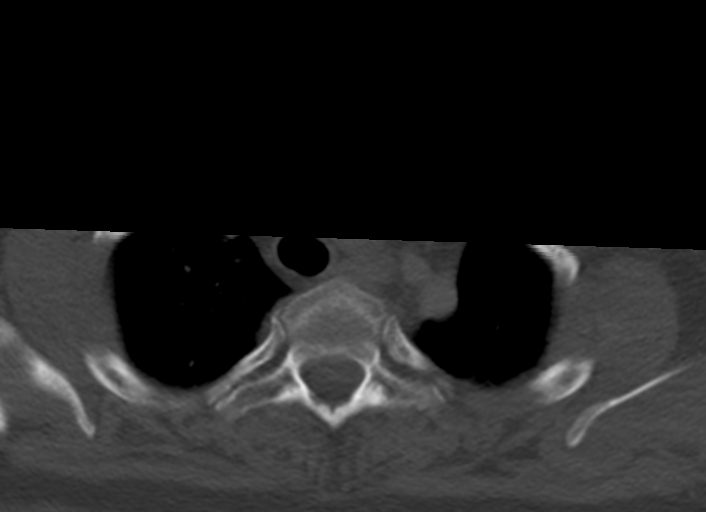
[im 48/111  bone]
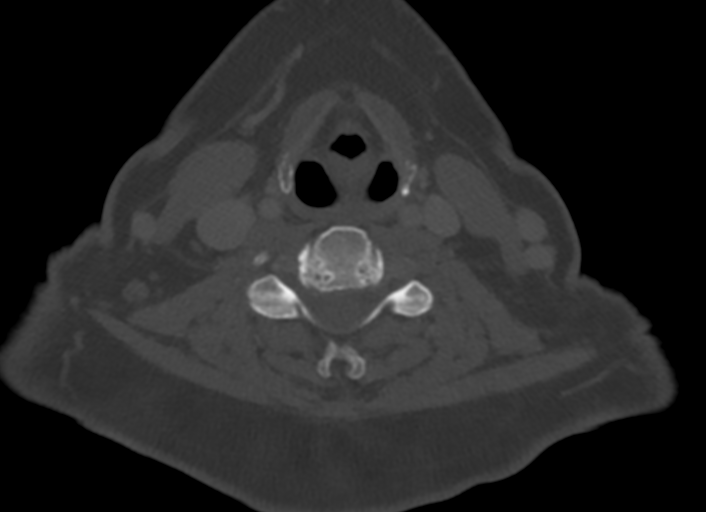
[im 63/111  bone]
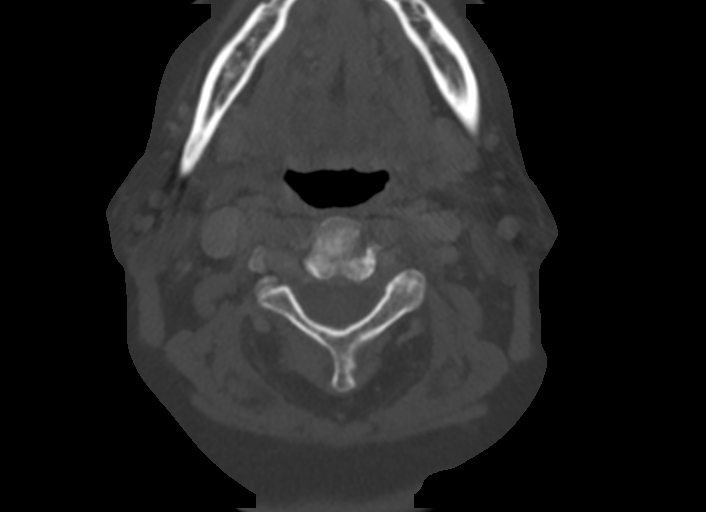
[im 95/111  bone]
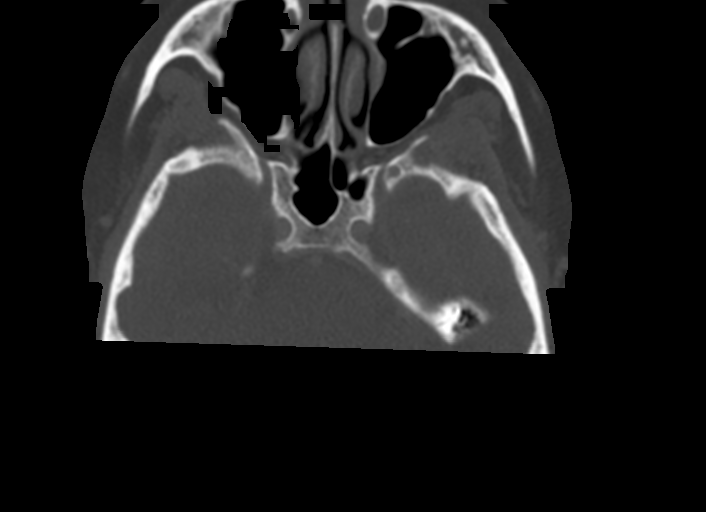

[12 of 33 positions shown; findings below may reference images not displayed]

RADIATION DOSE REDUCTION: This exam was performed according to the
departmental dose-optimization program which includes automated
exposure control, adjustment of the mA and/or kV according to
patient size and/or use of iterative reconstruction technique.

CONTRAST:  75mL OMNIPAQUE IOHEXOL 300 MG/ML  SOLN
FINDINGS: Pharynx and larynx: Larynx and pharynx soft tissue contours are
within normal limits. Negative parapharyngeal and retropharyngeal
spaces.

Salivary glands: Negative sublingual space, submandibular glands,
left parotid gland.

Marked area of clinical concern overlying the right mandible on
coronal image 82. Underlying the skin marker an oval, lobulated
hyperdense or hyperenhancing soft tissue mass inseparable from the
superficial lobe of the parotid is 18 x 12 x 15 mm (AP by transverse
by CC). The remainder of the right parotid gland appears normal,
right stylomastoid foramen appears normal. No regional inflammation
or lymphadenopathy.

Thyroid: Negative.

Lymph nodes: Negative. No cervical lymphadenopathy.

Vascular: Major vascular structures in the neck and at the skull
base appear patent. Dominant left vertebral artery.

Limited intracranial: Negative.

Visualized orbits: Negative.

Mastoids and visualized paranasal sinuses: Clear aside from small
left maxillary alveolar recess mucous retention cysts.

Skeleton: Negative. Age-appropriate cervical spine degeneration.

Upper chest: Negative.
IMPRESSION: 1. Palpable area of concern corresponds to a 18 x 12 x 15 mm soft
tissue mass arising from the superficial lobe of the right parotid
gland, most compatible with primary salivary neoplasm. Histology is
indeterminate by imaging but no overt malignant features are
identified.

2. Otherwise negative Neck CT.

## 2022-10-17 ENCOUNTER — Other Ambulatory Visit (HOSPITAL_COMMUNITY): Payer: Self-pay

## 2022-10-17 ENCOUNTER — Encounter: Payer: Self-pay | Admitting: Family

## 2022-10-17 ENCOUNTER — Other Ambulatory Visit: Payer: Self-pay

## 2022-10-17 ENCOUNTER — Other Ambulatory Visit: Payer: Self-pay | Admitting: Family

## 2022-10-17 DIAGNOSIS — E039 Hypothyroidism, unspecified: Secondary | ICD-10-CM

## 2022-10-17 DIAGNOSIS — R498 Other voice and resonance disorders: Secondary | ICD-10-CM

## 2022-10-17 MED ORDER — LEVOTHYROXINE SODIUM 125 MCG PO TABS
125.0000 ug | ORAL_TABLET | Freq: Every day | ORAL | 0 refills | Status: DC
  Filled 2022-10-17: qty 90, 90d supply, fill #0

## 2022-10-17 NOTE — Telephone Encounter (Signed)
Requesting: diazepam '2mg'$   Contract: None UDS: None Last Visit: 01/30/22 Next Visit: None Last Refill: 08/28/22 #60 and 0RF   Please Advise

## 2022-10-18 ENCOUNTER — Other Ambulatory Visit: Payer: Self-pay

## 2022-10-18 ENCOUNTER — Other Ambulatory Visit (HOSPITAL_COMMUNITY): Payer: Self-pay

## 2022-10-18 MED ORDER — DIAZEPAM 2 MG PO TABS
2.0000 mg | ORAL_TABLET | Freq: Two times a day (BID) | ORAL | 0 refills | Status: DC | PRN
  Filled 2022-10-18: qty 60, 30d supply, fill #0

## 2022-11-06 ENCOUNTER — Other Ambulatory Visit: Payer: Self-pay | Admitting: Family

## 2022-11-06 ENCOUNTER — Other Ambulatory Visit: Payer: Self-pay

## 2022-11-06 ENCOUNTER — Other Ambulatory Visit (HOSPITAL_COMMUNITY): Payer: Self-pay

## 2022-11-06 MED ORDER — PROPRANOLOL HCL 10 MG PO TABS
10.0000 mg | ORAL_TABLET | Freq: Three times a day (TID) | ORAL | 0 refills | Status: DC
  Filled 2022-11-06: qty 270, 90d supply, fill #0

## 2022-11-25 ENCOUNTER — Other Ambulatory Visit: Payer: Self-pay | Admitting: Family

## 2022-11-26 ENCOUNTER — Other Ambulatory Visit: Payer: Self-pay

## 2022-11-26 ENCOUNTER — Other Ambulatory Visit (HOSPITAL_COMMUNITY): Payer: Self-pay

## 2022-11-26 MED ORDER — CITALOPRAM HYDROBROMIDE 20 MG PO TABS
20.0000 mg | ORAL_TABLET | Freq: Every day | ORAL | 0 refills | Status: DC
  Filled 2022-11-26: qty 90, 90d supply, fill #0

## 2022-11-26 MED ORDER — PROPRANOLOL HCL ER 60 MG PO CP24
60.0000 mg | ORAL_CAPSULE | Freq: Every day | ORAL | 0 refills | Status: DC
  Filled 2022-11-26: qty 90, 90d supply, fill #0

## 2022-11-26 MED ORDER — ESOMEPRAZOLE MAGNESIUM 40 MG PO CPDR
40.0000 mg | DELAYED_RELEASE_CAPSULE | Freq: Every day | ORAL | 0 refills | Status: DC
  Filled 2022-11-26: qty 30, 30d supply, fill #0
  Filled 2023-01-08: qty 30, 30d supply, fill #1
  Filled 2023-03-27: qty 30, 30d supply, fill #2

## 2022-12-06 DIAGNOSIS — K118 Other diseases of salivary glands: Secondary | ICD-10-CM | POA: Diagnosis not present

## 2022-12-11 ENCOUNTER — Other Ambulatory Visit (HOSPITAL_COMMUNITY): Payer: Self-pay

## 2022-12-21 ENCOUNTER — Other Ambulatory Visit: Payer: Self-pay

## 2022-12-21 ENCOUNTER — Other Ambulatory Visit (HOSPITAL_COMMUNITY): Payer: Self-pay

## 2022-12-21 ENCOUNTER — Other Ambulatory Visit: Payer: Self-pay | Admitting: Family

## 2022-12-21 DIAGNOSIS — R498 Other voice and resonance disorders: Secondary | ICD-10-CM

## 2022-12-21 MED ORDER — DIAZEPAM 2 MG PO TABS
2.0000 mg | ORAL_TABLET | Freq: Two times a day (BID) | ORAL | 0 refills | Status: DC | PRN
Start: 2022-12-21 — End: 2023-02-12
  Filled 2022-12-21: qty 60, 30d supply, fill #0

## 2022-12-21 NOTE — Telephone Encounter (Signed)
Requesting: diazepam 2mg   Contract: None UDS: None Last Visit: 01/30/22 Next Visit: None Last Refill: 10/18/22 #60 and 0RF   Please Advise

## 2023-01-08 ENCOUNTER — Other Ambulatory Visit (HOSPITAL_COMMUNITY): Payer: Self-pay

## 2023-01-08 ENCOUNTER — Other Ambulatory Visit: Payer: Self-pay | Admitting: Family

## 2023-01-08 ENCOUNTER — Other Ambulatory Visit: Payer: Self-pay

## 2023-01-08 MED ORDER — LEVOTHYROXINE SODIUM 125 MCG PO TABS
125.0000 ug | ORAL_TABLET | Freq: Every day | ORAL | 0 refills | Status: DC
  Filled 2023-01-08: qty 90, 90d supply, fill #0

## 2023-02-01 ENCOUNTER — Other Ambulatory Visit: Payer: Self-pay | Admitting: Family

## 2023-02-01 ENCOUNTER — Other Ambulatory Visit: Payer: Self-pay

## 2023-02-01 MED ORDER — PROPRANOLOL HCL 10 MG PO TABS
10.0000 mg | ORAL_TABLET | Freq: Three times a day (TID) | ORAL | 0 refills | Status: DC
  Filled 2023-02-01: qty 270, 90d supply, fill #0

## 2023-02-12 ENCOUNTER — Other Ambulatory Visit (HOSPITAL_COMMUNITY): Payer: Self-pay

## 2023-02-12 ENCOUNTER — Other Ambulatory Visit: Payer: Self-pay

## 2023-02-12 ENCOUNTER — Other Ambulatory Visit: Payer: Self-pay | Admitting: Family

## 2023-02-12 DIAGNOSIS — R498 Other voice and resonance disorders: Secondary | ICD-10-CM

## 2023-02-12 MED ORDER — DIAZEPAM 2 MG PO TABS
2.0000 mg | ORAL_TABLET | Freq: Two times a day (BID) | ORAL | 0 refills | Status: DC | PRN
Start: 2023-02-12 — End: 2023-05-11
  Filled 2023-02-12: qty 60, 30d supply, fill #0

## 2023-02-18 ENCOUNTER — Other Ambulatory Visit: Payer: Self-pay

## 2023-03-27 ENCOUNTER — Other Ambulatory Visit (HOSPITAL_COMMUNITY): Payer: Self-pay

## 2023-03-27 ENCOUNTER — Encounter: Payer: Self-pay | Admitting: Family

## 2023-03-27 ENCOUNTER — Other Ambulatory Visit: Payer: Self-pay | Admitting: Family

## 2023-03-27 ENCOUNTER — Other Ambulatory Visit: Payer: Self-pay

## 2023-03-27 MED ORDER — CITALOPRAM HYDROBROMIDE 20 MG PO TABS
20.0000 mg | ORAL_TABLET | Freq: Every day | ORAL | 0 refills | Status: DC
  Filled 2023-03-27: qty 90, 90d supply, fill #0

## 2023-03-27 MED ORDER — PROPRANOLOL HCL ER 60 MG PO CP24
60.0000 mg | ORAL_CAPSULE | Freq: Every day | ORAL | 0 refills | Status: DC
  Filled 2023-03-27: qty 90, 90d supply, fill #0

## 2023-04-08 ENCOUNTER — Other Ambulatory Visit: Payer: Self-pay | Admitting: Family

## 2023-04-08 ENCOUNTER — Encounter: Payer: Self-pay | Admitting: *Deleted

## 2023-04-08 ENCOUNTER — Other Ambulatory Visit (HOSPITAL_COMMUNITY): Payer: Self-pay

## 2023-04-08 MED ORDER — LEVOTHYROXINE SODIUM 125 MCG PO TABS
125.0000 ug | ORAL_TABLET | Freq: Every day | ORAL | 0 refills | Status: DC
  Filled 2023-04-08: qty 30, 30d supply, fill #0

## 2023-04-09 ENCOUNTER — Other Ambulatory Visit (HOSPITAL_COMMUNITY): Payer: Self-pay

## 2023-04-17 ENCOUNTER — Encounter (INDEPENDENT_AMBULATORY_CARE_PROVIDER_SITE_OTHER): Payer: Self-pay

## 2023-04-29 ENCOUNTER — Other Ambulatory Visit: Payer: Self-pay | Admitting: Family

## 2023-04-30 ENCOUNTER — Ambulatory Visit (INDEPENDENT_AMBULATORY_CARE_PROVIDER_SITE_OTHER): Payer: BC Managed Care – PPO | Admitting: Family

## 2023-04-30 ENCOUNTER — Other Ambulatory Visit: Payer: Self-pay

## 2023-04-30 ENCOUNTER — Encounter: Payer: Self-pay | Admitting: Family

## 2023-04-30 ENCOUNTER — Other Ambulatory Visit (HOSPITAL_COMMUNITY): Payer: Self-pay

## 2023-04-30 VITALS — BP 118/74 | HR 70 | Resp 18 | Ht 65.0 in | Wt 191.2 lb

## 2023-04-30 DIAGNOSIS — R7309 Other abnormal glucose: Secondary | ICD-10-CM

## 2023-04-30 DIAGNOSIS — Z13220 Encounter for screening for lipoid disorders: Secondary | ICD-10-CM | POA: Diagnosis not present

## 2023-04-30 DIAGNOSIS — Z0000 Encounter for general adult medical examination without abnormal findings: Secondary | ICD-10-CM

## 2023-04-30 DIAGNOSIS — E034 Atrophy of thyroid (acquired): Secondary | ICD-10-CM | POA: Diagnosis not present

## 2023-04-30 DIAGNOSIS — Z8041 Family history of malignant neoplasm of ovary: Secondary | ICD-10-CM | POA: Diagnosis not present

## 2023-04-30 MED ORDER — ESOMEPRAZOLE MAGNESIUM 40 MG PO CPDR
40.0000 mg | DELAYED_RELEASE_CAPSULE | Freq: Every day | ORAL | 3 refills | Status: DC
Start: 2023-04-30 — End: 2024-06-01
  Filled 2023-04-30: qty 90, 90d supply, fill #0
  Filled 2023-07-29: qty 90, 90d supply, fill #1
  Filled 2023-10-30: qty 90, 90d supply, fill #2
  Filled 2024-02-23 – 2024-02-28 (×2): qty 90, 90d supply, fill #3

## 2023-04-30 MED ORDER — PROPRANOLOL HCL ER 60 MG PO CP24
60.0000 mg | ORAL_CAPSULE | Freq: Every day | ORAL | 3 refills | Status: DC
  Filled 2023-04-30 – 2023-06-22 (×2): qty 90, 90d supply, fill #0
  Filled 2023-09-27: qty 90, 90d supply, fill #1
  Filled 2023-12-25: qty 90, 90d supply, fill #2
  Filled 2024-03-24: qty 90, 90d supply, fill #3

## 2023-04-30 MED ORDER — CITALOPRAM HYDROBROMIDE 20 MG PO TABS
20.0000 mg | ORAL_TABLET | Freq: Every day | ORAL | 3 refills | Status: DC
  Filled 2023-04-30 – 2023-06-22 (×2): qty 90, 90d supply, fill #0
  Filled 2023-09-27: qty 90, 90d supply, fill #1
  Filled 2023-12-25: qty 90, 90d supply, fill #2
  Filled 2024-03-24: qty 90, 90d supply, fill #3

## 2023-04-30 NOTE — Progress Notes (Signed)
Katherine Garrett is a 57 y.o. female with the following history as recorded in EpicCare:  Patient Active Problem List   Diagnosis Date Noted   Parotid mass 08/20/2022   COVID-19 06/22/2022   Gastroesophageal reflux disease 08/31/2020   Vocal tremor 08/05/2018   Insomnia 08/24/2016   Obese 05/12/2015   Hypothyroid 04/15/2011   Benign head tremor 04/15/2011   Anxiety 04/15/2011    Current Outpatient Medications  Medication Sig Dispense Refill   albuterol (VENTOLIN HFA) 108 (90 Base) MCG/ACT inhaler INHALE 2 PUFFS INTO THE LUNGS EVERY 6 HOURS AS NEEDED FOR WHEEZING OR SHORTNESS OF BREATH. 6.7 g 1   b complex vitamins capsule Take 1 capsule by mouth daily.     diazepam (VALIUM) 2 MG tablet Take 1 tablet (2 mg total) by mouth every 12 (twelve) hours as needed. MUST SCHEDULE OV FOR FURTHER REFILLS 60 tablet 0   ketoconazole (NIZORAL) 2 % cream Apply 1 application topically 2 (two) times daily. 30 g 1   levothyroxine (SYNTHROID) 125 MCG tablet Take 1 tablet (125 mcg total) by mouth daily before breakfast. 30 tablet 0   Polyethylene Glycol 3350 (MIRALAX PO) Miralax     propranolol (INDERAL) 10 MG tablet Take 1 tablet (10 mg total) by mouth 3 (three) times daily. OV required for further refills 270 tablet 0   triamcinolone cream (KENALOG) 0.1 % Apply 1 application topically 2 (two) times daily. 30 g 0   VITAMIN D PO Take by mouth.     citalopram (CELEXA) 20 MG tablet Take 1 tablet (20 mg total) by mouth daily. 90 tablet 3   esomeprazole (NEXIUM) 40 MG capsule Take 1 capsule (40 mg total) by mouth daily at 12 noon. 90 capsule 3   propranolol ER (INDERAL LA) 60 MG 24 hr capsule Take 1 capsule (60 mg total) by mouth daily. 90 capsule 3   No current facility-administered medications for this visit.    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   Urine, incontinence, stress female     Past Surgical History:  Procedure Laterality Date   Child birth  57 & 48   x's 2, nvd's   PAROTIDECTOMY Right 08/20/2022   Procedure: SUPERFICIAL PAROTIDECTOMY;  Surgeon: Serena Colonel, MD;  Location: [facility];  Service: ENT;  Laterality: Right;   SALIVARY GLAND SURGERY  11/2021   parotid gland biopsy   TONSILLECTOMY  09/17/1968   UPPER GASTROINTESTINAL ENDOSCOPY      Family History  Problem Relation Age of Onset   Arthritis Mother    Ovarian cancer Mother 82   Hyperlipidemia Mother    Anxiety disorder Mother    Depression Mother    Arthritis Father    Anxiety disorder Father    Diabetes Father 72       type 2   Seizures Father    Cirrhosis Brother        hep c   Heart disease Maternal Grandmother    Heart disease Maternal Grandfather    Colon cancer Neg Hx    Colon polyps Neg Hx    Esophageal cancer Neg Hx    Stomach cancer Neg Hx  Rectal cancer Neg Hx     Social History   Tobacco Use   Smoking status: Never   Smokeless tobacco: Never  Substance Use Topics   Alcohol use: Yes    Alcohol/week: 3.0 - 4.0 standard drinks of alcohol    Types: 2 - 3 Glasses of wine, 1 Standard drinks or equivalent per week    Comment: rarely, monthly 2-3 times    Subjective:   Presents for yearly CPE; no acute concerns- does need refills and to update labs; Did retire in May; still considering follow up with speech pathology at [facility] for tremor management;   Health Maintenance  Topic Date Due   Zoster Vaccines- Shingrix (1 of 2) Never done   COVID-19 Vaccine (3 - 2023-24 season) 05/18/2022   INFLUENZA VACCINE  04/18/2023   MAMMOGRAM  08/01/2023   Colonoscopy  05/28/2025   PAP SMEAR-Modifier  08/31/2025   DTaP/Tdap/Td (4 - Td or Tdap) 01/31/2032   Hepatitis C Screening  Completed    HIV Screening  Completed   HPV VACCINES  Aged Out   Fecal DNA (Cologuard)  Discontinued   Review of Systems  Constitutional: Negative.   HENT: Negative.    Eyes: Negative.   Respiratory: Negative.    Cardiovascular: Negative.   Gastrointestinal: Negative.   Genitourinary: Negative.   Musculoskeletal: Negative.   Skin: Negative.   Neurological: Negative.   Endo/Heme/Allergies: Negative.   Psychiatric/Behavioral: Negative.       Objective:  Vitals:   04/30/23 1250  BP: 118/74  Pulse: 70  Resp: 18  SpO2: 96%  Weight: 191 lb 3.2 oz (86.7 kg)  Height: 5\' 5"  (1.651 m)    General: Well developed, well nourished, in no acute distress  Skin : Warm and dry.  Head: Normocephalic and atraumatic  Eyes: Sclera and conjunctiva clear; pupils round and reactive to light; extraocular movements intact  Ears: External normal; canals clear; tympanic membranes normal  Oropharynx: Pink, supple. No suspicious lesions  Neck: Supple without thyromegaly, adenopathy  Lungs: Respirations unlabored; clear to auscultation bilaterally without wheeze, rales, rhonchi  CVS exam: normal rate and regular rhythm.  Abdomen: Soft; nontender; nondistended; normoactive bowel sounds; no masses or hepatosplenomegaly  Musculoskeletal: No deformities; no active joint inflammation  Extremities: No edema, cyanosis, clubbing  Vessels: Symmetric bilaterally  Neurologic: Alert and oriented; speech intact; face symmetrical; moves all extremities well; CNII-XII intact without focal deficit   Assessment:  1. PE (physical exam), annual   2. Hypothyroidism due to acquired atrophy of thyroid   3. Lipid screening   4. Elevated glucose   5. FH: ovarian cancer     Plan:  Age appropriate preventive healthcare needs addressed; encouraged regular eye doctor and dental exams; encouraged regular exercise; will update labs and refills as needed today; follow-up in 1 year, sooner prn.    No follow-ups on file.  Orders Placed  This Encounter  Procedures   CBC with Differential/Platelet   Comp Met (CMET)   Lipid panel   TSH   Hemoglobin A1c   CA 125    Requested Prescriptions   Signed Prescriptions Disp Refills   citalopram (CELEXA) 20 MG tablet 90 tablet 3    Sig: Take 1 tablet (20 mg total) by mouth daily.   esomeprazole (NEXIUM) 40 MG capsule 90 capsule 3    Sig: Take 1 capsule (40 mg total) by mouth daily at 12 noon.   propranolol ER (INDERAL LA) 60 MG 24 hr capsule 90 capsule 3  Sig: Take 1 capsule (60 mg total) by mouth daily.

## 2023-05-01 ENCOUNTER — Other Ambulatory Visit: Payer: Self-pay

## 2023-05-01 ENCOUNTER — Other Ambulatory Visit: Payer: Self-pay | Admitting: Family

## 2023-05-01 MED ORDER — LEVOTHYROXINE SODIUM 125 MCG PO TABS
125.0000 ug | ORAL_TABLET | Freq: Every day | ORAL | 3 refills | Status: DC
Start: 2023-05-01 — End: 2024-05-14
  Filled 2023-05-01: qty 90, 90d supply, fill #0
  Filled 2023-07-29: qty 90, 90d supply, fill #1
  Filled 2023-10-31: qty 90, 90d supply, fill #2
  Filled 2024-01-28: qty 90, 90d supply, fill #3

## 2023-05-02 ENCOUNTER — Encounter: Payer: Self-pay | Admitting: Family

## 2023-05-02 ENCOUNTER — Other Ambulatory Visit: Payer: Self-pay

## 2023-05-02 ENCOUNTER — Other Ambulatory Visit (HOSPITAL_COMMUNITY): Payer: Self-pay

## 2023-05-11 ENCOUNTER — Other Ambulatory Visit: Payer: Self-pay | Admitting: Family

## 2023-05-11 DIAGNOSIS — R498 Other voice and resonance disorders: Secondary | ICD-10-CM

## 2023-05-13 NOTE — Telephone Encounter (Signed)
Requesting: diazepam 2mg   Contract: None UDS: None Last Visit: 04/30/23 Next Visit: None Last Refill: 02/12/23 #60 and 8JX   Please Advise

## 2023-05-14 ENCOUNTER — Other Ambulatory Visit: Payer: Self-pay

## 2023-05-14 ENCOUNTER — Other Ambulatory Visit (HOSPITAL_COMMUNITY): Payer: Self-pay

## 2023-05-14 MED ORDER — DIAZEPAM 2 MG PO TABS
2.0000 mg | ORAL_TABLET | Freq: Two times a day (BID) | ORAL | 0 refills | Status: AC | PRN
Start: 2023-05-14 — End: ?
  Filled 2023-05-14: qty 60, 30d supply, fill #0

## 2023-06-17 ENCOUNTER — Other Ambulatory Visit: Payer: Self-pay | Admitting: Family

## 2023-06-17 DIAGNOSIS — R498 Other voice and resonance disorders: Secondary | ICD-10-CM

## 2023-06-17 NOTE — Telephone Encounter (Signed)
Requesting: diazepam 2mg   Contract: None UDS: None Last Visit: 04/30/23 Next Visit: None Last Refill: 05/14/23 #60 and 0RF  Please Advise

## 2023-06-18 ENCOUNTER — Other Ambulatory Visit (HOSPITAL_COMMUNITY): Payer: Self-pay

## 2023-06-18 ENCOUNTER — Encounter: Payer: Self-pay | Admitting: Family

## 2023-06-18 ENCOUNTER — Other Ambulatory Visit: Payer: Self-pay

## 2023-06-18 MED ORDER — DIAZEPAM 2 MG PO TABS
2.0000 mg | ORAL_TABLET | Freq: Two times a day (BID) | ORAL | 0 refills | Status: DC | PRN
Start: 2023-06-18 — End: 2023-08-06
  Filled 2023-06-18: qty 60, 30d supply, fill #0

## 2023-06-18 MED ORDER — PROPRANOLOL HCL 10 MG PO TABS
10.0000 mg | ORAL_TABLET | Freq: Three times a day (TID) | ORAL | 1 refills | Status: DC | PRN
  Filled 2023-06-18: qty 270, 90d supply, fill #0

## 2023-06-19 ENCOUNTER — Other Ambulatory Visit (HOSPITAL_COMMUNITY): Payer: Self-pay

## 2023-06-20 ENCOUNTER — Other Ambulatory Visit: Payer: Self-pay

## 2023-06-22 ENCOUNTER — Other Ambulatory Visit (HOSPITAL_COMMUNITY): Payer: Self-pay

## 2023-07-30 ENCOUNTER — Other Ambulatory Visit: Payer: Self-pay

## 2023-08-04 ENCOUNTER — Other Ambulatory Visit: Payer: Self-pay | Admitting: Family

## 2023-08-04 DIAGNOSIS — R498 Other voice and resonance disorders: Secondary | ICD-10-CM

## 2023-08-05 NOTE — Telephone Encounter (Signed)
Requesting: diazepam 2mg   Contract: None UDS: None Last Visit: 04/30/23 Next Visit: None Last Refill: 06/18/23 #60 and 0RF   Please Advise

## 2023-08-06 ENCOUNTER — Other Ambulatory Visit (HOSPITAL_COMMUNITY): Payer: Self-pay

## 2023-08-06 ENCOUNTER — Other Ambulatory Visit: Payer: Self-pay | Admitting: Family

## 2023-08-06 DIAGNOSIS — R498 Other voice and resonance disorders: Secondary | ICD-10-CM

## 2023-08-06 MED ORDER — DIAZEPAM 2 MG PO TABS
2.0000 mg | ORAL_TABLET | Freq: Two times a day (BID) | ORAL | 0 refills | Status: DC | PRN
  Filled 2023-08-06: qty 60, 30d supply, fill #0

## 2023-08-15 ENCOUNTER — Encounter: Payer: Self-pay | Admitting: Family

## 2023-09-28 ENCOUNTER — Other Ambulatory Visit: Payer: Self-pay | Admitting: Family

## 2023-09-28 ENCOUNTER — Other Ambulatory Visit (HOSPITAL_COMMUNITY): Payer: Self-pay

## 2023-09-28 DIAGNOSIS — R498 Other voice and resonance disorders: Secondary | ICD-10-CM

## 2023-09-30 ENCOUNTER — Other Ambulatory Visit: Payer: Self-pay

## 2023-09-30 MED ORDER — DIAZEPAM 2 MG PO TABS
2.0000 mg | ORAL_TABLET | Freq: Two times a day (BID) | ORAL | 0 refills | Status: DC | PRN
  Filled 2023-09-30: qty 60, 30d supply, fill #0

## 2023-09-30 NOTE — Telephone Encounter (Signed)
 Requesting: diazepam 2mg   Contract: None UDS: None Last Visit: 04/30/23 Next Visit: None Last Refill: 08/06/23 #60 and 0RF  Please Advise

## 2023-10-30 ENCOUNTER — Other Ambulatory Visit (HOSPITAL_COMMUNITY): Payer: Self-pay

## 2023-10-31 ENCOUNTER — Other Ambulatory Visit (HOSPITAL_COMMUNITY): Payer: Self-pay

## 2023-11-08 ENCOUNTER — Other Ambulatory Visit: Payer: Self-pay | Admitting: Family

## 2023-11-08 ENCOUNTER — Other Ambulatory Visit (HOSPITAL_COMMUNITY): Payer: Self-pay

## 2023-11-08 DIAGNOSIS — R498 Other voice and resonance disorders: Secondary | ICD-10-CM

## 2023-11-08 MED ORDER — DIAZEPAM 2 MG PO TABS
2.0000 mg | ORAL_TABLET | Freq: Two times a day (BID) | ORAL | 0 refills | Status: DC | PRN
  Filled 2023-11-08: qty 60, 30d supply, fill #0

## 2023-11-08 NOTE — Telephone Encounter (Signed)
Requesting: diazepam 2mg   Contract: None UDS: None Last Visit: 04/30/23 Next Visit: None Last Refill: 09/30/23 #60 and 0RF   Please Advise

## 2023-12-25 ENCOUNTER — Other Ambulatory Visit (HOSPITAL_COMMUNITY): Payer: Self-pay

## 2024-01-02 ENCOUNTER — Other Ambulatory Visit: Payer: Self-pay

## 2024-01-02 ENCOUNTER — Other Ambulatory Visit: Payer: Self-pay | Admitting: Family

## 2024-01-02 DIAGNOSIS — R498 Other voice and resonance disorders: Secondary | ICD-10-CM

## 2024-01-02 MED ORDER — DIAZEPAM 2 MG PO TABS
2.0000 mg | ORAL_TABLET | Freq: Two times a day (BID) | ORAL | 0 refills | Status: DC | PRN
  Filled 2024-01-02: qty 60, 30d supply, fill #0

## 2024-01-02 NOTE — Telephone Encounter (Signed)
 Requesting: diazepam  Contract: None UDS: None Last Visit: 04/30/23 Next Visit: None Last Refill: 11/08/23 #60 and 0RF   Please Advise

## 2024-01-28 ENCOUNTER — Other Ambulatory Visit (HOSPITAL_COMMUNITY): Payer: Self-pay

## 2024-01-29 ENCOUNTER — Other Ambulatory Visit: Payer: Self-pay

## 2024-01-30 ENCOUNTER — Encounter: Payer: Self-pay | Admitting: Pharmacist

## 2024-01-30 ENCOUNTER — Other Ambulatory Visit: Payer: Self-pay

## 2024-01-31 ENCOUNTER — Other Ambulatory Visit: Payer: Self-pay

## 2024-02-04 ENCOUNTER — Ambulatory Visit (INDEPENDENT_AMBULATORY_CARE_PROVIDER_SITE_OTHER)

## 2024-02-04 ENCOUNTER — Encounter: Payer: Self-pay | Admitting: Family

## 2024-02-04 ENCOUNTER — Ambulatory Visit (INDEPENDENT_AMBULATORY_CARE_PROVIDER_SITE_OTHER): Admitting: Family

## 2024-02-04 VITALS — BP 122/76 | HR 64 | Ht 65.0 in | Wt 190.0 lb

## 2024-02-04 DIAGNOSIS — R7309 Other abnormal glucose: Secondary | ICD-10-CM

## 2024-02-04 DIAGNOSIS — E034 Atrophy of thyroid (acquired): Secondary | ICD-10-CM | POA: Diagnosis not present

## 2024-02-04 DIAGNOSIS — H81399 Other peripheral vertigo, unspecified ear: Secondary | ICD-10-CM | POA: Diagnosis not present

## 2024-02-04 DIAGNOSIS — R42 Dizziness and giddiness: Secondary | ICD-10-CM

## 2024-02-04 NOTE — Progress Notes (Signed)
 Katherine Garrett is a 58 y.o. female with the following history as recorded in EpicCare:  Patient Active Problem List   Diagnosis Date Noted   Parotid mass 08/20/2022   COVID-19 06/22/2022   Gastroesophageal reflux disease 08/31/2020   Vocal tremor 08/05/2018   Insomnia 08/24/2016   Obese 05/12/2015   Hypothyroid 04/15/2011   Benign head tremor 04/15/2011   Anxiety 04/15/2011    Current Outpatient Medications  Medication Sig Dispense Refill   albuterol  (VENTOLIN  HFA) 108 (90 Base) MCG/ACT inhaler INHALE 2 PUFFS INTO THE LUNGS EVERY 6 HOURS AS NEEDED FOR WHEEZING OR SHORTNESS OF BREATH. 6.7 g 1   b complex vitamins capsule Take 1 capsule by mouth daily.     citalopram  (CELEXA ) 20 MG tablet Take 1 tablet (20 mg) by mouth daily. 90 tablet 3   diazepam  (VALIUM ) 2 MG tablet Take 1 tablet (2 mg total) by mouth every 12 (twelve) hours as needed. 60 tablet 0   esomeprazole  (NEXIUM ) 40 MG capsule Take 1 capsule (40 mg) by mouth daily at 12 noon. 90 capsule 3   ketoconazole  (NIZORAL ) 2 % cream Apply 1 application topically 2 (two) times daily. 30 g 1   levothyroxine  (SYNTHROID ) 125 MCG tablet Take 1 tablet (125 mcg total) by mouth daily before breakfast. 90 tablet 3   Polyethylene Glycol 3350 (MIRALAX PO) Miralax     propranolol  (INDERAL ) 10 MG tablet Take 1 tablet (10 mg total) by mouth up to 3 (three) times daily as needed for tremor control. 270 tablet 1   propranolol  ER (INDERAL  LA) 60 MG 24 hr capsule Take 1 capsule (60 mg) by mouth daily. 90 capsule 3   triamcinolone  cream (KENALOG ) 0.1 % Apply 1 application topically 2 (two) times daily. 30 g 0   VITAMIN D  PO Take by mouth.     No current facility-administered medications for this visit.    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   Urine, incontinence, stress female     Past Surgical History:  Procedure Laterality Date   Child birth  77 & 16   x's 2, nvd's   PAROTIDECTOMY Right 08/20/2022   Procedure: SUPERFICIAL PAROTIDECTOMY;  Surgeon: Janita Mellow, MD;  Location: [facility];  Service: ENT;  Laterality: Right;   SALIVARY GLAND SURGERY  11/2021   parotid gland biopsy   TONSILLECTOMY  09/17/1968   UPPER GASTROINTESTINAL ENDOSCOPY      Family History  Problem Relation Age of Onset   Arthritis Mother    Ovarian cancer Mother 6   Hyperlipidemia Mother    Anxiety disorder Mother    Depression Mother    Arthritis Father    Anxiety disorder Father    Diabetes Father 78       type 2   Seizures Father    Cirrhosis Brother        hep c   Heart disease Maternal Grandmother    Heart disease Maternal Grandfather    Colon cancer Neg Hx    Colon polyps Neg Hx    Esophageal cancer Neg Hx    Stomach cancer Neg Hx    Rectal cancer Neg Hx  Social History   Tobacco Use   Smoking status: Never   Smokeless tobacco: Never  Substance Use Topics   Alcohol use: Yes    Alcohol/week: 3.0 - 4.0 standard drinks of alcohol    Types: 2 - 3 Glasses of wine, 1 Standard drinks or equivalent per week    Comment: rarely, monthly 2-3 times    Subjective:   Concerned about onset of dizziness which started last Friday- notes that when she woke up and rolled onto her side, the room felt like it was spinning for about 10 seconds; patient feels like symptoms are similar to vertigo which she has had in the past; notes she had a similar incident 6 months ago- also presented with sensation of room spinning;  Notes that symptoms today are improved from when they first started; has been taking OTC Dramamine and did Epley maneuver;    Objective:  Vitals:   02/04/24 1502  BP: 122/76  Pulse: 64  SpO2: 96%  Weight: 190 lb (86.2 kg)  Height: 5\' 5"   (1.651 m)    General: Well developed, well nourished, in no acute distress  Skin : Warm and dry.  Head: Normocephalic and atraumatic  Eyes: Sclera and conjunctiva clear; pupils round and reactive to light; extraocular movements intact  Ears: External normal; canals clear; tympanic membranes normal  Oropharynx: Pink, supple. No suspicious lesions  Neck: Supple without thyromegaly, adenopathy  Lungs: Respirations unlabored; clear to auscultation bilaterally without wheeze, rales, rhonchi  CVS exam: normal rate and regular rhythm.  Abdomen: Soft; nontender; nondistended; normoactive bowel sounds; no masses or hepatosplenomegaly  Musculoskeletal: No deformities; no active joint inflammation  Extremities: No edema, cyanosis, clubbing  Vessels: Symmetric bilaterally  Neurologic: Alert and oriented; speech intact; face symmetrical; moves all extremities well; CNII-XII intact without focal deficit  Assessment:  1. Dizziness   2. Other peripheral vertigo, unspecified ear   3. Hypothyroidism due to acquired atrophy of thyroid    4. Elevated glucose     Plan:  EKG shows sinus rhythm; physical exam is reassuring; will update labs today; update head CT- will be done at [facility]; follow up to be determined; can consider referral to vestibular therapy if patient interested;   No follow-ups on file.  Orders Placed This Encounter  Procedures   CT HEAD WO CONTRAST ( )    Standing Status:   Future    Number of Occurrences:   1    Expiration Date:   02/03/2025    Is patient pregnant?:   No    Preferred imaging location?:   [facility]   CBC with Differential/Platelet   Comp Met (CMET)   TSH   Hemoglobin A1c   EKG 12-Lead    Requested Prescriptions    No prescriptions requested or ordered in this encounter

## 2024-02-05 ENCOUNTER — Ambulatory Visit: Payer: Self-pay | Admitting: Family

## 2024-02-05 LAB — CBC WITH DIFFERENTIAL/PLATELET
Basophils Absolute: 0.1 10*3/uL (ref 0.0–0.1)
Basophils Relative: 1.1 % (ref 0.0–3.0)
Eosinophils Absolute: 0.1 10*3/uL (ref 0.0–0.7)
Eosinophils Relative: 2.1 % (ref 0.0–5.0)
HCT: 42.9 % (ref 36.0–46.0)
Hemoglobin: 14.2 g/dL (ref 12.0–15.0)
Lymphocytes Relative: 38.1 % (ref 12.0–46.0)
Lymphs Abs: 2.6 10*3/uL (ref 0.7–4.0)
MCHC: 33.2 g/dL (ref 30.0–36.0)
MCV: 87.5 fl (ref 78.0–100.0)
Monocytes Absolute: 0.5 10*3/uL (ref 0.1–1.0)
Monocytes Relative: 6.9 % (ref 3.0–12.0)
Neutro Abs: 3.5 10*3/uL (ref 1.4–7.7)
Neutrophils Relative %: 51.8 % (ref 43.0–77.0)
Platelets: 273 10*3/uL (ref 150.0–400.0)
RBC: 4.9 Mil/uL (ref 3.87–5.11)
RDW: 15.1 % (ref 11.5–15.5)
WBC: 6.8 10*3/uL (ref 4.0–10.5)

## 2024-02-05 LAB — COMPREHENSIVE METABOLIC PANEL WITH GFR
ALT: 18 U/L (ref 0–35)
AST: 20 U/L (ref 0–37)
Albumin: 4.3 g/dL (ref 3.5–5.2)
Alkaline Phosphatase: 85 U/L (ref 39–117)
BUN: 16 mg/dL (ref 6–23)
CO2: 30 meq/L (ref 19–32)
Calcium: 9.3 mg/dL (ref 8.4–10.5)
Chloride: 101 meq/L (ref 96–112)
Creatinine, Ser: 0.93 mg/dL (ref 0.40–1.20)
GFR: 66.92 mL/min (ref >60.00)
Glucose, Bld: 89 mg/dL (ref 70–99)
Potassium: 4.3 meq/L (ref 3.5–5.1)
Sodium: 138 meq/L (ref 135–145)
Total Bilirubin: 0.3 mg/dL (ref 0.2–1.2)
Total Protein: 6.7 g/dL (ref 6.0–8.3)

## 2024-02-05 LAB — TSH: TSH: 1.43 u[IU]/mL (ref 0.35–5.50)

## 2024-02-05 LAB — HEMOGLOBIN A1C: Hgb A1c MFr Bld: 5.7 % (ref 4.6–6.5)

## 2024-02-06 ENCOUNTER — Other Ambulatory Visit (HOSPITAL_COMMUNITY): Payer: Self-pay

## 2024-02-06 ENCOUNTER — Other Ambulatory Visit: Payer: Self-pay

## 2024-02-06 ENCOUNTER — Other Ambulatory Visit: Payer: Self-pay | Admitting: Family

## 2024-02-06 DIAGNOSIS — R498 Other voice and resonance disorders: Secondary | ICD-10-CM

## 2024-02-06 DIAGNOSIS — G250 Essential tremor: Secondary | ICD-10-CM

## 2024-02-06 MED ORDER — ONDANSETRON 8 MG PO TBDP
8.0000 mg | ORAL_TABLET | Freq: Three times a day (TID) | ORAL | 0 refills | Status: DC | PRN
  Filled 2024-02-06: qty 20, 7d supply, fill #0

## 2024-02-06 MED ORDER — MECLIZINE HCL 25 MG PO TABS
25.0000 mg | ORAL_TABLET | Freq: Three times a day (TID) | ORAL | 0 refills | Status: DC | PRN
  Filled 2024-02-06: qty 30, 10d supply, fill #0

## 2024-02-23 ENCOUNTER — Other Ambulatory Visit: Payer: Self-pay | Admitting: Family

## 2024-02-23 DIAGNOSIS — R498 Other voice and resonance disorders: Secondary | ICD-10-CM

## 2024-02-24 ENCOUNTER — Other Ambulatory Visit: Payer: Self-pay

## 2024-02-27 ENCOUNTER — Other Ambulatory Visit: Payer: Self-pay

## 2024-02-28 ENCOUNTER — Other Ambulatory Visit (HOSPITAL_COMMUNITY): Payer: Self-pay

## 2024-02-28 ENCOUNTER — Encounter: Payer: Self-pay | Admitting: Family

## 2024-03-02 NOTE — Telephone Encounter (Signed)
 Requesting: diazepam  2mg   Contract: None UDS: None Last Visit: 02/04/24 Next Visit: None Last Refill: 01/02/24 #60 and 0RF   Please Advise

## 2024-03-03 ENCOUNTER — Other Ambulatory Visit: Payer: Self-pay

## 2024-03-03 ENCOUNTER — Other Ambulatory Visit (HOSPITAL_COMMUNITY): Payer: Self-pay

## 2024-03-03 MED ORDER — DIAZEPAM 2 MG PO TABS
2.0000 mg | ORAL_TABLET | Freq: Two times a day (BID) | ORAL | 0 refills | Status: DC | PRN
  Filled 2024-03-03 – 2024-03-09 (×2): qty 60, 30d supply, fill #0

## 2024-03-06 ENCOUNTER — Other Ambulatory Visit (HOSPITAL_COMMUNITY): Payer: Self-pay

## 2024-03-09 ENCOUNTER — Other Ambulatory Visit (HOSPITAL_COMMUNITY): Payer: Self-pay

## 2024-03-24 ENCOUNTER — Other Ambulatory Visit: Payer: Self-pay

## 2024-04-16 ENCOUNTER — Other Ambulatory Visit: Payer: Self-pay | Admitting: Family

## 2024-04-16 DIAGNOSIS — R498 Other voice and resonance disorders: Secondary | ICD-10-CM

## 2024-04-17 ENCOUNTER — Other Ambulatory Visit (HOSPITAL_COMMUNITY): Payer: Self-pay

## 2024-04-17 MED ORDER — DIAZEPAM 2 MG PO TABS
2.0000 mg | ORAL_TABLET | Freq: Two times a day (BID) | ORAL | 0 refills | Status: DC | PRN
  Filled 2024-04-17: qty 60, 30d supply, fill #0

## 2024-05-14 ENCOUNTER — Other Ambulatory Visit (HOSPITAL_COMMUNITY): Payer: Self-pay

## 2024-05-14 ENCOUNTER — Other Ambulatory Visit: Payer: Self-pay | Admitting: Family

## 2024-05-14 MED ORDER — LEVOTHYROXINE SODIUM 125 MCG PO TABS
125.0000 ug | ORAL_TABLET | Freq: Every day | ORAL | 1 refills | Status: DC
  Filled 2024-05-14: qty 90, 90d supply, fill #0

## 2024-05-15 ENCOUNTER — Other Ambulatory Visit: Payer: Self-pay

## 2024-05-16 ENCOUNTER — Encounter: Payer: Self-pay | Admitting: Family

## 2024-05-19 ENCOUNTER — Other Ambulatory Visit: Payer: Self-pay | Admitting: Family

## 2024-05-19 ENCOUNTER — Other Ambulatory Visit (HOSPITAL_COMMUNITY): Payer: Self-pay

## 2024-05-19 ENCOUNTER — Other Ambulatory Visit: Payer: Self-pay

## 2024-05-19 ENCOUNTER — Telehealth: Payer: Self-pay

## 2024-05-19 DIAGNOSIS — R498 Other voice and resonance disorders: Secondary | ICD-10-CM

## 2024-05-19 MED ORDER — DIAZEPAM 2 MG PO TABS
2.0000 mg | ORAL_TABLET | Freq: Two times a day (BID) | ORAL | 0 refills | Status: DC | PRN
  Filled 2024-05-19: qty 60, 30d supply, fill #0

## 2024-05-19 NOTE — Telephone Encounter (Signed)
 Copied from CRM [id number]. Topic: Appointments - Appointment Scheduling >> May 19, 2024 12:47 PM Jayma L wrote: Patient/patient representative is calling to schedule an appointment. Refer to attachments for appointment information.    Patient is requesting to be seen by elizabeth crawford, wants to be scheduled with her. I tried to find the next appt and it was so far pushed out it wouldn't even show me. Please callback patient and let her know when Dr. Rogena next appt will be.

## 2024-05-20 NOTE — Telephone Encounter (Signed)
 I am not taking np which is why no apt was available. Was this explained to patient?

## 2024-06-01 ENCOUNTER — Other Ambulatory Visit: Payer: Self-pay | Admitting: Family

## 2024-06-02 ENCOUNTER — Other Ambulatory Visit: Payer: Self-pay

## 2024-06-02 ENCOUNTER — Other Ambulatory Visit (HOSPITAL_COMMUNITY): Payer: Self-pay

## 2024-06-02 MED ORDER — ESOMEPRAZOLE MAGNESIUM 40 MG PO CPDR
40.0000 mg | DELAYED_RELEASE_CAPSULE | Freq: Every day | ORAL | 0 refills | Status: DC
  Filled 2024-06-02 (×2): qty 90, 90d supply, fill #0

## 2024-07-10 ENCOUNTER — Other Ambulatory Visit: Payer: Self-pay

## 2024-07-21 ENCOUNTER — Ambulatory Visit: Admitting: Family Medicine

## 2024-07-21 ENCOUNTER — Encounter: Payer: Self-pay | Admitting: Family Medicine

## 2024-07-21 ENCOUNTER — Other Ambulatory Visit (HOSPITAL_COMMUNITY): Payer: Self-pay

## 2024-07-21 ENCOUNTER — Other Ambulatory Visit: Payer: Self-pay

## 2024-07-21 VITALS — BP 108/60 | HR 62 | Temp 98.7°F | Ht 65.0 in | Wt 191.4 lb

## 2024-07-21 DIAGNOSIS — E782 Mixed hyperlipidemia: Secondary | ICD-10-CM | POA: Insufficient documentation

## 2024-07-21 DIAGNOSIS — R490 Dysphonia: Secondary | ICD-10-CM

## 2024-07-21 DIAGNOSIS — E039 Hypothyroidism, unspecified: Secondary | ICD-10-CM | POA: Diagnosis not present

## 2024-07-21 DIAGNOSIS — F3340 Major depressive disorder, recurrent, in remission, unspecified: Secondary | ICD-10-CM | POA: Insufficient documentation

## 2024-07-21 DIAGNOSIS — R110 Nausea: Secondary | ICD-10-CM

## 2024-07-21 DIAGNOSIS — K219 Gastro-esophageal reflux disease without esophagitis: Secondary | ICD-10-CM

## 2024-07-21 DIAGNOSIS — R251 Tremor, unspecified: Secondary | ICD-10-CM | POA: Insufficient documentation

## 2024-07-21 LAB — CBC WITH DIFFERENTIAL/PLATELET
Basophils Absolute: 0.1 K/uL (ref 0.0–0.1)
Basophils Relative: 0.9 % (ref 0.0–3.0)
Eosinophils Absolute: 0.1 K/uL (ref 0.0–0.7)
Eosinophils Relative: 2.5 % (ref 0.0–5.0)
HCT: 41.8 % (ref 36.0–46.0)
Hemoglobin: 14 g/dL (ref 12.0–15.0)
Lymphocytes Relative: 31.5 % (ref 12.0–46.0)
Lymphs Abs: 1.8 K/uL (ref 0.7–4.0)
MCHC: 33.5 g/dL (ref 30.0–36.0)
MCV: 87.6 fl (ref 78.0–100.0)
Monocytes Absolute: 0.5 K/uL (ref 0.1–1.0)
Monocytes Relative: 7.9 % (ref 3.0–12.0)
Neutro Abs: 3.3 K/uL (ref 1.4–7.7)
Neutrophils Relative %: 57.2 % (ref 43.0–77.0)
Platelets: 218 K/uL (ref 150.0–400.0)
RBC: 4.77 Mil/uL (ref 3.87–5.11)
RDW: 14.6 % (ref 11.5–15.5)
WBC: 5.8 K/uL (ref 4.0–10.5)

## 2024-07-21 LAB — COMPREHENSIVE METABOLIC PANEL WITH GFR
ALT: 25 U/L (ref 0–35)
AST: 26 U/L (ref 0–37)
Albumin: 4.2 g/dL (ref 3.5–5.2)
Alkaline Phosphatase: 78 U/L (ref 39–117)
BUN: 15 mg/dL (ref 6–23)
CO2: 31 meq/L (ref 19–32)
Calcium: 9.4 mg/dL (ref 8.4–10.5)
Chloride: 101 meq/L (ref 96–112)
Creatinine, Ser: 0.87 mg/dL (ref 0.40–1.20)
GFR: 72.26 mL/min (ref >60.00)
Glucose, Bld: 99 mg/dL (ref 70–99)
Potassium: 4 meq/L (ref 3.5–5.1)
Sodium: 139 meq/L (ref 135–145)
Total Bilirubin: 0.4 mg/dL (ref 0.2–1.2)
Total Protein: 7.1 g/dL (ref 6.0–8.3)

## 2024-07-21 LAB — LIPID PANEL
Cholesterol: 225 mg/dL — ABNORMAL HIGH (ref 0–200)
HDL: 72.9 mg/dL (ref >39.00)
LDL Cholesterol: 136 mg/dL — ABNORMAL HIGH (ref 0–99)
NonHDL: 152.36
Total CHOL/HDL Ratio: 3
Triglycerides: 82 mg/dL (ref 0.0–149.0)
VLDL: 16.4 mg/dL (ref 0.0–40.0)

## 2024-07-21 MED ORDER — MECLIZINE HCL 25 MG PO TABS
25.0000 mg | ORAL_TABLET | Freq: Three times a day (TID) | ORAL | 0 refills | Status: AC | PRN
  Filled 2024-07-21: qty 30, 30d supply, fill #0

## 2024-07-21 MED ORDER — PROPRANOLOL HCL ER 60 MG PO CP24
60.0000 mg | ORAL_CAPSULE | Freq: Every day | ORAL | 3 refills | Status: AC
  Filled 2024-07-21: qty 90, 90d supply, fill #0
  Filled 2024-10-15: qty 90, 90d supply, fill #1

## 2024-07-21 MED ORDER — PROPRANOLOL HCL 10 MG PO TABS
10.0000 mg | ORAL_TABLET | Freq: Three times a day (TID) | ORAL | 1 refills | Status: AC | PRN
  Filled 2024-07-21: qty 270, 90d supply, fill #0

## 2024-07-21 MED ORDER — ESOMEPRAZOLE MAGNESIUM 40 MG PO CPDR
40.0000 mg | DELAYED_RELEASE_CAPSULE | Freq: Every day | ORAL | 0 refills | Status: AC
  Filled 2024-07-21 – 2024-08-28 (×2): qty 90, 90d supply, fill #0

## 2024-07-21 MED ORDER — ONDANSETRON 8 MG PO TBDP
8.0000 mg | ORAL_TABLET | Freq: Three times a day (TID) | ORAL | 0 refills | Status: AC | PRN
  Filled 2024-07-21: qty 20, 7d supply, fill #0

## 2024-07-21 MED ORDER — CITALOPRAM HYDROBROMIDE 10 MG PO TABS
10.0000 mg | ORAL_TABLET | Freq: Every day | ORAL | 3 refills | Status: AC
  Filled 2024-07-21: qty 30, 30d supply, fill #0
  Filled 2024-08-28: qty 30, 30d supply, fill #1
  Filled 2024-09-29: qty 30, 30d supply, fill #2

## 2024-07-21 MED ORDER — LEVOTHYROXINE SODIUM 125 MCG PO TABS
125.0000 ug | ORAL_TABLET | Freq: Every day | ORAL | 1 refills | Status: AC
  Filled 2024-07-21: qty 90, 90d supply, fill #0

## 2024-07-21 MED ORDER — DIAZEPAM 5 MG PO TABS
5.0000 mg | ORAL_TABLET | Freq: Two times a day (BID) | ORAL | 1 refills | Status: DC | PRN
  Filled 2024-07-21: qty 45, 23d supply, fill #0
  Filled 2024-08-21: qty 45, 23d supply, fill #1

## 2024-07-21 NOTE — Progress Notes (Addendum)
 New Patient Visit  Subjective:     Patient ID: Katherine Garrett, female    DOB: 1966/05/15, 58 y.o.   MRN: [id number]  No chief complaint on file.   HPI  Discussed the use of AI scribe software for clinical note transcription with the patient, who gave verbal consent to proceed.  History of Present Illness Katherine Garrett is a 58 year old female with essential tremor who presents for medication management and follow-up.  Tremor and associated symptoms - Tremor present for 14 years, primarily affecting the head during daytime hours - Tremor worsens with stress and in social situations - Vocal tremor also present - No symptoms of shuffling gait or leg tremors - Family history of tremor, Parkinson's disease, and dementia - No prior neurology consultation  Medication management for tremor - Currently taking propranolol  ER 60 mg once daily, with option to increase to three times daily - Experiences extreme fatigue as a side effect of propranolol  - Previous trials of Topamax  and primidone  resulted in severe bone pain; allergic to both medications - Hesitant to try Neurontin or Lyrica - Not considering deep brain stimulation  Menopausal symptoms and endocrine concerns - Undergoing menopause, with associated weight changes - Thyroid  dysfunction present; on thyroid  medication with no recent dose adjustments - Difficulty achieving target heart rate during exercise  Mood disturbance - Takes Celexa  10 mg daily for mood - Initiated Celexa  after experiencing emotional numbness - History of Prozac use following husband's death in 08/15/03 - Desires to experience emotions more fully  Cardiovascular and metabolic findings - Blood pressure ranges from 110s to 120s mmHg - Heart rate in the 50s to 60s bpm - History of elevated blood sugar without diabetes diagnosis - Cholesterol slightly elevated with high HDL     ROS Per HPI  Outpatient Encounter Medications as of 07/21/2024   Medication Sig   albuterol  (VENTOLIN  HFA) 108 (90 Base) MCG/ACT inhaler INHALE 2 PUFFS INTO THE LUNGS EVERY 6 HOURS AS NEEDED FOR WHEEZING OR SHORTNESS OF BREATH.   b complex vitamins capsule Take 1 capsule by mouth daily.   citalopram  (CELEXA ) 10 MG tablet Take 1 tablet (10 mg total) by mouth daily.   diazepam  (VALIUM ) 5 MG tablet Take 1 tablet (5 mg total) by mouth every 12 (twelve) hours as needed for muscle spasms.   ketoconazole  (NIZORAL ) 2 % cream Apply 1 application topically 2 (two) times daily.   Polyethylene Glycol 3350 (MIRALAX PO) Miralax   triamcinolone  cream (KENALOG ) 0.1 % Apply 1 application topically 2 (two) times daily.   VITAMIN D  PO Take by mouth.   [DISCONTINUED] citalopram  (CELEXA ) 20 MG tablet Take 1 tablet (20 mg) by mouth daily.   [DISCONTINUED] diazepam  (VALIUM ) 2 MG tablet Take 1 tablet (2 mg total) by mouth every 12 (twelve) hours as needed.   [DISCONTINUED] esomeprazole  (NEXIUM ) 40 MG capsule Take 1 capsule (40 mg) by mouth daily at 12 noon.   [DISCONTINUED] levothyroxine  (SYNTHROID ) 125 MCG tablet Take 1 tablet (125 mcg total) by mouth daily before breakfast.   [DISCONTINUED] meclizine  (ANTIVERT ) 25 MG tablet Take 1 tablet (25 mg total) by mouth 3 (three) times daily as needed for dizziness.   [DISCONTINUED] ondansetron  (ZOFRAN -ODT) 8 MG disintegrating tablet Take 1 tablet (8 mg total) by mouth every 8 (eight) hours as needed for nausea or vomiting.   [DISCONTINUED] propranolol  (INDERAL ) 10 MG tablet Take 1 tablet (10 mg total) by mouth up to 3 (three) times daily as needed for tremor  control.   [DISCONTINUED] propranolol  ER (INDERAL  LA) 60 MG 24 hr capsule Take 1 capsule (60 mg) by mouth daily.   esomeprazole  (NEXIUM ) 40 MG capsule Take 1 capsule (40 mg) by mouth daily at 12 noon.   levothyroxine  (SYNTHROID ) 125 MCG tablet Take 1 tablet (125 mcg total) by mouth daily before breakfast.   meclizine  (ANTIVERT ) 25 MG tablet Take 1 tablet (25 mg total) by mouth 3  (three) times daily as needed for dizziness.   ondansetron  (ZOFRAN -ODT) 8 MG disintegrating tablet Take 1 tablet (8 mg total) by mouth every 8 (eight) hours as needed for nausea or vomiting.   propranolol  (INDERAL ) 10 MG tablet Take 1 tablet (10 mg total) by mouth up to 3 (three) times daily as needed for tremor control.   propranolol  ER (INDERAL  LA) 60 MG 24 hr capsule Take 1 capsule (60 mg) by mouth daily.   No facility-administered encounter medications on file as of 07/21/2024.    Past Medical History:  Diagnosis Date   Allergy    Anxiety    Arthritis    Depression    GERD (gastroesophageal reflux disease)    History of TB skin testing    Hypothyroid dx 07/28/1997   Insomnia 2003-07-29 onset   following death of spouse   Migraines    Neuromuscular disorder (HCC)    Tremor, essential 03/18/2011   vocal   Urine, incontinence, stress female     Past Surgical History:  Procedure Laterality Date   Child birth  46 & 55   x's 2, nvd's   PAROTIDECTOMY Right 08/20/2022   Procedure: SUPERFICIAL PAROTIDECTOMY;  Surgeon: Jesus Oliphant, MD;  Location: [facility];  Service: ENT;  Laterality: Right;   SALIVARY GLAND SURGERY  11/2021   parotid gland biopsy   TONSILLECTOMY  09/17/1968   UPPER GASTROINTESTINAL ENDOSCOPY      Family History  Problem Relation Age of Onset   Arthritis Mother    Ovarian cancer Mother 66   Hyperlipidemia Mother    Anxiety disorder Mother    Depression Mother    Cancer Mother    Obesity Mother    Arthritis Father    Anxiety disorder Father    Diabetes Father 37       type 2   Seizures Father    Cirrhosis Brother        hep c   Heart disease Maternal Grandmother    Heart disease Maternal Grandfather    Drug abuse Brother    Early death Sister    Colon cancer Neg Hx    Colon polyps Neg Hx    Esophageal cancer Neg Hx    Stomach cancer Neg Hx    Rectal cancer Neg Hx     Social History   Socioeconomic History   Marital status: Widowed     Spouse name: Not on file   Number of children: Not on file   Years of education: Not on file   Highest education level: Bachelor's degree (e.g., BA, AB, BS)  Occupational History   Not on file  Tobacco Use   Smoking status: Never   Smokeless tobacco: Never  Vaping Use   Vaping status: Never Used  Substance and Sexual Activity   Alcohol use: Yes    Alcohol/week: 3.0 - 4.0 standard drinks of alcohol    Types: 2 - 3 Glasses of wine, 1 Standard drinks or equivalent per week    Comment: rarely, monthly 2-3 times   Drug use: Never  Sexual activity: Yes    Partners: Male    Birth control/protection: Post-menopausal  Other Topics Concern   Not on file  Social History Narrative   Husband died in 2002/10/31 from stomach cancer.   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)   Social Drivers of Health   Financial Resource Strain: Low Risk  (04/29/2023)   Overall Financial Resource Strain (CARDIA)    Difficulty of Paying Living Expenses: Not hard at all  Food Insecurity: No Food Insecurity (04/29/2023)   Hunger Vital Sign    Worried About Running Out of Food in the Last Year: Never true    Ran Out of Food in the Last Year: Never true  Transportation Needs: No Transportation Needs (04/29/2023)   PRAPARE - Administrator, Civil Service (Medical): No    Lack of Transportation (Non-Medical): No  Physical Activity: Insufficiently Active (04/29/2023)   Exercise Vital Sign    Days of Exercise per Week: 2 days    Minutes of Exercise per Session: 30 min  Stress: No Stress Concern Present (04/29/2023)   [employer] of Occupational Health - Occupational Stress Questionnaire    Feeling of Stress : Only a little  Social Connections: Unknown (04/29/2023)   Social Connection and Isolation Panel    Frequency of Communication with Friends and Family: More than three times a week    Frequency of Social Gatherings with Friends and Family: Twice a week    Attends  Religious Services: Patient declined    Database Administrator or Organizations: No    Attends Engineer, Structural: Not on file    Marital Status: Widowed  Catering Manager Violence: Not on file       Objective:    BP 108/60 (BP Location: Left Arm, Patient Position: Sitting)   Pulse 62   Temp 98.7 F (37.1 C) (Temporal)   Ht 5' 5 (1.651 m)   Wt 191 lb 6.4 oz (86.8 kg)   LMP 04/11/2017   SpO2 95%   BMI 31.85 kg/m    Physical Exam Vitals and nursing note reviewed.  Constitutional:      General: She is not in acute distress. HENT:     Head: Normocephalic and atraumatic.     Comments: Head tremor    Right Ear: External ear normal.     Left Ear: External ear normal.     Nose: Nose normal.     Mouth/Throat:     Mouth: Mucous membranes are moist.     Pharynx: Oropharynx is clear.     Comments: Vocal cord tremor Eyes:     Extraocular Movements: Extraocular movements intact.     Pupils: Pupils are equal, round, and reactive to light.  Cardiovascular:     Rate and Rhythm: Normal rate and regular rhythm.     Pulses: Normal pulses.  Pulmonary:     Effort: Pulmonary effort is normal. No respiratory distress.     Breath sounds: Normal breath sounds. No wheezing, rhonchi or rales.  Musculoskeletal:        General: Normal range of motion.     Cervical back: Normal range of motion.     Right lower leg: No edema.     Left lower leg: No edema.  Lymphadenopathy:     Cervical: No cervical adenopathy.  Neurological:     General: No focal deficit present.     Mental Status: She is alert and oriented to person, place,  and time.  Psychiatric:        Mood and Affect: Mood normal.        Thought Content: Thought content normal.     No results found for any visits on 07/21/24.      Assessment & Plan:   Assessment and Plan Assessment & Plan Dysphonia of essential tremor Chronic essential tremor with vocal involvement, managed with Inderal . Declined Botox. Concerns  about fatigue and blood pressure with increased dosage. No neurology consultation yet. Prefers to avoid Neurontin, Lyrica, and deep brain stimulation. - Increased Inderal  to 45 tablets per month for potential twice daily dosing. - Referred to Dr. Virgia at [facility] for neurology evaluation. - Monitor blood pressure and heart rate, especially if fatigue increases. - Consider baclofen for stress-related tremors if needed.  MDD, rec, in remission On Celexa  10 mg. Reports emotional numbness, indicating possible suboptimal management. - Continue Celexa  10 mg daily.  Acquired Hypothyroidism Managed with thyroid  medication. No recent dose adjustments needed. - Ordered thyroid  function tests as part of routine labs.  Gastroesophageal reflux disease without esophagitis, nausea Managed with Nexium . Reports insurance coverage issues. - Continue Nexium  as prescribed. - Monitor insurance coverage and adjust as needed. - zofran  prn  Mixed hyperlipidemia Previous labs showed slightly elevated cholesterol with favorable HDL levels. No current medication adjustment needed. - Ordered lipid panel as part of routine labs.      Orders Placed This Encounter  Procedures   CBC with Differential/Platelet    Release to patient:   Immediate [1]   Comprehensive metabolic panel with GFR    Release to patient:   Immediate [1]   Lipid panel   TSH   Ambulatory referral to Neurology    Referral Priority:   Routine    Referral Type:   Consultation    Referral Reason:   Specialty Services Required    Requested Specialty:   Neurology    Number of Visits Requested:   1     Meds ordered this encounter  Medications   esomeprazole  (NEXIUM ) 40 MG capsule    Sig: Take 1 capsule (40 mg) by mouth daily at 12 noon.    Dispense:  90 capsule    Refill:  0   levothyroxine  (SYNTHROID ) 125 MCG tablet    Sig: Take 1 tablet (125 mcg total) by mouth daily before breakfast.    Dispense:  90 tablet    Refill:  1    meclizine  (ANTIVERT ) 25 MG tablet    Sig: Take 1 tablet (25 mg total) by mouth 3 (three) times daily as needed for dizziness.    Dispense:  30 tablet    Refill:  0   ondansetron  (ZOFRAN -ODT) 8 MG disintegrating tablet    Sig: Take 1 tablet (8 mg total) by mouth every 8 (eight) hours as needed for nausea or vomiting.    Dispense:  20 tablet    Refill:  0   propranolol  (INDERAL ) 10 MG tablet    Sig: Take 1 tablet (10 mg total) by mouth up to 3 (three) times daily as needed for tremor control.    Dispense:  270 tablet    Refill:  1   propranolol  ER (INDERAL  LA) 60 MG 24 hr capsule    Sig: Take 1 capsule (60 mg) by mouth daily.    Dispense:  90 capsule    Refill:  3   citalopram  (CELEXA ) 10 MG tablet    Sig: Take 1 tablet (10 mg total) by mouth daily.  Dispense:  30 tablet    Refill:  3   diazepam  (VALIUM ) 5 MG tablet    Sig: Take 1 tablet (5 mg total) by mouth every 12 (twelve) hours as needed for muscle spasms.    Dispense:  45 tablet    Refill:  1    Return in about 6 months (around 01/18/2025) for cpe.  Corean LITTIE Ku, FNP

## 2024-07-21 NOTE — Addendum Note (Signed)
 Addended by: Omolola Mittman L on: 07/21/2024 01:10 PM   Modules accepted: Level of Service

## 2024-07-21 NOTE — Patient Instructions (Addendum)
 Welcome to [facility]!  Thank you for choosing us  for your Primary Care needs.   We offer in person and video appointments for your convenience. You may call our office to schedule appointments, or you may schedule appointments with me through MyChart.   The best way to get in contact with me is via MyChart message.This will get to me faster than a phone call, unless there is an emergency, then please call 911.  The lab is located downstairs in the Sports Medicine building, we also have xray available there.   Dr Tat with Neuro is involved with the [facility].  Referral to neuro today. Someone should be reaching out to get you scheduled.   Follow-up with me in 6 mos for medication management, sooner if needed.

## 2024-07-22 ENCOUNTER — Other Ambulatory Visit (HOSPITAL_COMMUNITY): Payer: Self-pay

## 2024-07-22 LAB — TSH: TSH: 1.78 u[IU]/mL (ref 0.35–5.50)

## 2024-07-23 DIAGNOSIS — Z8041 Family history of malignant neoplasm of ovary: Secondary | ICD-10-CM | POA: Diagnosis not present

## 2024-07-23 DIAGNOSIS — Z1231 Encounter for screening mammogram for malignant neoplasm of breast: Secondary | ICD-10-CM | POA: Diagnosis not present

## 2024-07-23 DIAGNOSIS — Z01419 Encounter for gynecological examination (general) (routine) without abnormal findings: Secondary | ICD-10-CM | POA: Diagnosis not present

## 2024-07-23 DIAGNOSIS — Z1151 Encounter for screening for human papillomavirus (HPV): Secondary | ICD-10-CM | POA: Diagnosis not present

## 2024-07-23 DIAGNOSIS — Z6833 Body mass index (BMI) 33.0-33.9, adult: Secondary | ICD-10-CM | POA: Diagnosis not present

## 2024-07-23 DIAGNOSIS — Z124 Encounter for screening for malignant neoplasm of cervix: Secondary | ICD-10-CM | POA: Diagnosis not present

## 2024-07-27 ENCOUNTER — Ambulatory Visit: Payer: Self-pay | Admitting: Family Medicine

## 2024-07-28 ENCOUNTER — Encounter: Payer: Self-pay | Admitting: Family Medicine

## 2024-07-28 NOTE — Telephone Encounter (Signed)
 Reviewed

## 2024-08-14 ENCOUNTER — Other Ambulatory Visit (HOSPITAL_COMMUNITY): Payer: Self-pay

## 2024-08-21 ENCOUNTER — Other Ambulatory Visit: Payer: Self-pay

## 2024-08-21 ENCOUNTER — Other Ambulatory Visit (HOSPITAL_BASED_OUTPATIENT_CLINIC_OR_DEPARTMENT_OTHER): Payer: Self-pay

## 2024-08-28 ENCOUNTER — Other Ambulatory Visit (HOSPITAL_COMMUNITY): Payer: Self-pay

## 2024-08-28 ENCOUNTER — Other Ambulatory Visit: Payer: Self-pay

## 2024-09-26 ENCOUNTER — Encounter: Payer: Self-pay | Admitting: Family Medicine

## 2024-09-28 ENCOUNTER — Telehealth: Payer: Self-pay

## 2024-09-28 NOTE — Telephone Encounter (Signed)
 Copied from CRM #[id number]. Topic: Clinical - Prescription Issue >> Sep 28, 2024 12:35 PM Laymon HERO wrote: Reason for CRM: Patient had to go out of town unexpectedly and needed to get a weeks worth of levothyroxine , citalopram  and Inderal  ER sent to the [place]  [facility] pharmacy on file.SABRA Please contact patient if this can be done for her

## 2024-09-28 NOTE — Telephone Encounter (Signed)
 Addressed via MyChart.

## 2024-09-29 ENCOUNTER — Other Ambulatory Visit: Payer: Self-pay | Admitting: Family Medicine

## 2024-09-29 ENCOUNTER — Other Ambulatory Visit (HOSPITAL_COMMUNITY): Payer: Self-pay

## 2024-09-29 ENCOUNTER — Other Ambulatory Visit: Payer: Self-pay

## 2024-09-29 DIAGNOSIS — R251 Tremor, unspecified: Secondary | ICD-10-CM

## 2024-09-29 MED ORDER — DIAZEPAM 5 MG PO TABS
5.0000 mg | ORAL_TABLET | Freq: Two times a day (BID) | ORAL | 1 refills | Status: AC | PRN
  Filled 2024-09-29: qty 45, 23d supply, fill #0

## 2024-10-15 ENCOUNTER — Other Ambulatory Visit (HOSPITAL_COMMUNITY): Payer: Self-pay
